# Patient Record
Sex: Male | Born: 1995 | Race: Black or African American | Hispanic: No | Marital: Single | Smoking: Never smoker
Health system: Southern US, Community
[De-identification: ages and names within clinical notes are randomized; demographics above are authoritative.]

## PROBLEM LIST (undated history)

## (undated) DIAGNOSIS — A549 Gonococcal infection, unspecified: Secondary | ICD-10-CM

---

## 2014-08-11 ENCOUNTER — Emergency Department (HOSPITAL_BASED_OUTPATIENT_CLINIC_OR_DEPARTMENT_OTHER)
Admission: EM | Admit: 2014-08-11 | Discharge: 2014-08-12 | Disposition: A | Discharge status: Home / Self Care | Payer: Self-pay | Attending: Emergency Medicine | Admitting: Emergency Medicine

## 2014-08-11 ENCOUNTER — Encounter (HOSPITAL_BASED_OUTPATIENT_CLINIC_OR_DEPARTMENT_OTHER): Payer: Self-pay

## 2014-08-11 DIAGNOSIS — R369 Urethral discharge, unspecified: Secondary | ICD-10-CM | POA: Insufficient documentation

## 2014-08-11 DIAGNOSIS — R300 Dysuria: Secondary | ICD-10-CM | POA: Insufficient documentation

## 2014-08-11 LAB — URINALYSIS, ROUTINE W REFLEX MICROSCOPIC
Glucose, UA: NEGATIVE mg/dL
HGB URINE DIPSTICK: NEGATIVE
Ketones, ur: 40 mg/dL — AB
Nitrite: NEGATIVE
PROTEIN: 30 mg/dL — AB
Specific Gravity, Urine: 1.035 — ABNORMAL HIGH (ref 1.005–1.030)
Urobilinogen, UA: 1 mg/dL (ref 0.0–1.0)
pH: 6.5 (ref 5.0–8.0)

## 2014-08-11 LAB — URINE MICROSCOPIC-ADD ON

## 2014-08-11 MED ORDER — AZITHROMYCIN 250 MG PO TABS
1000.0000 mg | ORAL_TABLET | Freq: Once | ORAL | Status: AC
  Administered 2014-08-11: 1000 mg via ORAL
  Filled 2014-08-11: qty 4

## 2014-08-11 MED ORDER — CEFTRIAXONE SODIUM 250 MG IJ SOLR
250.0000 mg | Freq: Once | INTRAMUSCULAR | Status: AC
  Administered 2014-08-11: 250 mg via INTRAMUSCULAR
  Filled 2014-08-11: qty 250

## 2014-08-11 MED ORDER — LIDOCAINE HCL (PF) 1 % IJ SOLN
INTRAMUSCULAR | Status: AC
  Administered 2014-08-11: 1 mL
  Filled 2014-08-11: qty 5

## 2014-08-11 NOTE — ED Notes (Signed)
Penile d/c-started yesterday

## 2014-08-11 NOTE — ED Notes (Signed)
C/o penile dc x 2 days  Burning w urination

## 2014-08-11 NOTE — ED Provider Notes (Signed)
CSN: [id number]     Arrival date & time 08/11/14  2202 History  This chart was scribed for Mirian MoMatthew Gentry, MD by Richarda Overlieichard Holland, ED Scribe. This patient was seen in room MH08/MH08 and the patient's care was started 11:01 PM.   Chief Complaint  Patient presents with  . Penile Discharge   Patient is a 19 y.o. male presenting with penile discharge. The history is provided by the patient.  Penile Discharge This is a new problem. The current episode started 12 to 24 hours ago. The problem occurs rarely. The problem has not changed since onset.Nothing aggravates the symptoms. Nothing relieves the symptoms. He has tried nothing for the symptoms.   HPI Comments: Kenneth [name] is a 19 y.o. male who presents to the Emergency Department complaining of penile discharge that started yesterday. He describes the discharge as greenish/yellow. Pt states he experiences some mild burning with urination. He denies any pain, nausea, vomiting or fevers.   History reviewed. No pertinent past medical history.   History reviewed. No pertinent past surgical history.   No family history on file.   History  Substance Use Topics  . Smoking status: Never Smoker   . Smokeless tobacco: Not on file  . Alcohol Use: No    Review of Systems  Constitutional: Negative for fever.  Gastrointestinal: Negative for nausea and vomiting.  Genitourinary: Positive for dysuria and discharge.  All other systems reviewed and are negative.  Allergies  Keflex  Home Medications   Prior to Admission medications   Not on File   BP 146/87 mmHg  Pulse 107  Temp(Src) 98.4 F (36.9 C)  Resp 16  Ht 5\' 9"  (1.753 m)  Wt 186 lb (84.369 kg)  BMI 27.45 kg/m2  SpO2 100% Physical Exam  Constitutional: He is oriented to person, place, and time. He appears well-developed and well-nourished.  HENT:  Head: Normocephalic and atraumatic.  Eyes: Conjunctivae and EOM are normal.  Neck: Normal range of motion. Neck supple.   Cardiovascular: Normal rate, regular rhythm and normal heart sounds.   Pulmonary/Chest: Effort normal and breath sounds normal. No respiratory distress.  Abdominal: He exhibits no distension. There is no tenderness. There is no rebound and no guarding. Hernia confirmed negative in the right inguinal area and confirmed negative in the left inguinal area.  Genitourinary: Testes normal. Cremasteric reflex is present. Right testis shows no swelling and no tenderness. Left testis shows no swelling and no tenderness. No penile erythema or penile tenderness. Discharge (scant) found.  Musculoskeletal: Normal range of motion.  Lymphadenopathy:       Right: No inguinal adenopathy present.       Left: No inguinal adenopathy present.  Neurological: He is alert and oriented to person, place, and time.  Skin: Skin is warm and dry.  Vitals reviewed.   ED Course  Procedures   DIAGNOSTIC STUDIES: Oxygen Saturation is 100% on RA, normal by my interpretation.    COORDINATION OF CARE: 11:04 PM Discussed treatment plan with pt at bedside and pt agreed to plan.   Labs Review Labs Reviewed  URINALYSIS, ROUTINE W REFLEX MICROSCOPIC (NOT AT [facility]) - Abnormal; Notable for the following:    Color, Urine AMBER (*)    APPearance CLOUDY (*)    Specific Gravity, Urine 1.035 (*)    Bilirubin Urine SMALL (*)    Ketones, ur 40 (*)    Protein, ur 30 (*)    Leukocytes, UA MODERATE (*)    All other components within normal  limits  URINE CULTURE  URINE MICROSCOPIC-ADD ON  GC/CHLAMYDIA PROBE AMP ([place]) NOT AT [facility]    Imaging Review No results found.   EKG Interpretation None      MDM   Final diagnoses:  Penile discharge    19 y.o. male without pertinent PMH presents with penile dc after sexual contact.  History and exam concerning for STI.  Swabbed dc.  Treated empirically and counseled testing/treatment of partner(s).  DC home in stable condition after rocephin/azithro.    I have reviewed all  laboratory and imaging studies if ordered as above  1. Penile discharge           Mirian Mo, MD 08/12/14 [phone]

## 2014-08-11 NOTE — Discharge Instructions (Signed)

## 2014-08-13 LAB — URINE CULTURE
Colony Count: NO GROWTH
Culture: NO GROWTH

## 2014-08-13 LAB — GC/CHLAMYDIA PROBE AMP (~~LOC~~) NOT AT ARMC
Chlamydia: NEGATIVE
Neisseria Gonorrhea: POSITIVE — AB

## 2014-08-14 ENCOUNTER — Telehealth (HOSPITAL_BASED_OUTPATIENT_CLINIC_OR_DEPARTMENT_OTHER): Payer: Self-pay | Admitting: Emergency Medicine

## 2014-08-15 ENCOUNTER — Telehealth (HOSPITAL_COMMUNITY): Payer: Self-pay

## 2014-08-17 ENCOUNTER — Telehealth (HOSPITAL_BASED_OUTPATIENT_CLINIC_OR_DEPARTMENT_OTHER): Payer: Self-pay | Admitting: Emergency Medicine

## 2014-09-01 ENCOUNTER — Telehealth (HOSPITAL_BASED_OUTPATIENT_CLINIC_OR_DEPARTMENT_OTHER): Payer: Self-pay | Admitting: Emergency Medicine

## 2014-09-01 NOTE — Telephone Encounter (Signed)
Lost to followup, letter returned, no known forwarding address

## 2014-09-20 ENCOUNTER — Telehealth (HOSPITAL_COMMUNITY): Payer: Self-pay

## 2014-09-20 NOTE — ED Notes (Signed)
Unable to contact pt by mail or telephone. Unable to communicate lab results or treatment changes. 

## 2015-06-01 ENCOUNTER — Encounter (HOSPITAL_BASED_OUTPATIENT_CLINIC_OR_DEPARTMENT_OTHER): Payer: Self-pay

## 2015-06-01 ENCOUNTER — Emergency Department (HOSPITAL_BASED_OUTPATIENT_CLINIC_OR_DEPARTMENT_OTHER)
Admission: EM | Admit: 2015-06-01 | Discharge: 2015-06-01 | Disposition: A | Discharge status: Home / Self Care | Payer: Managed Care, Other (non HMO) | Attending: Emergency Medicine | Admitting: Emergency Medicine

## 2015-06-01 DIAGNOSIS — Z8619 Personal history of other infectious and parasitic diseases: Secondary | ICD-10-CM | POA: Diagnosis not present

## 2015-06-01 DIAGNOSIS — N342 Other urethritis: Secondary | ICD-10-CM | POA: Insufficient documentation

## 2015-06-01 DIAGNOSIS — J029 Acute pharyngitis, unspecified: Secondary | ICD-10-CM | POA: Diagnosis not present

## 2015-06-01 HISTORY — DX: Gonococcal infection, unspecified: A54.9

## 2015-06-01 LAB — URINALYSIS, ROUTINE W REFLEX MICROSCOPIC
Bilirubin Urine: NEGATIVE
Glucose, UA: NEGATIVE mg/dL
Hgb urine dipstick: NEGATIVE
Ketones, ur: NEGATIVE mg/dL
NITRITE: NEGATIVE
PROTEIN: NEGATIVE mg/dL
Specific Gravity, Urine: 1.017 (ref 1.005–1.030)
pH: 7.5 (ref 5.0–8.0)

## 2015-06-01 LAB — URINE MICROSCOPIC-ADD ON

## 2015-06-01 MED ORDER — CEFTRIAXONE SODIUM 250 MG IJ SOLR
250.0000 mg | Freq: Once | INTRAMUSCULAR | Status: AC
  Administered 2015-06-01: 250 mg via INTRAMUSCULAR
  Filled 2015-06-01: qty 250

## 2015-06-01 MED ORDER — METRONIDAZOLE 500 MG PO TABS
2000.0000 mg | ORAL_TABLET | Freq: Once | ORAL | Status: AC
  Administered 2015-06-01: 2000 mg via ORAL
  Filled 2015-06-01: qty 4

## 2015-06-01 MED ORDER — LIDOCAINE HCL (PF) 1 % IJ SOLN
INTRAMUSCULAR | Status: AC
  Administered 2015-06-01: 1.2 mL
  Filled 2015-06-01: qty 5

## 2015-06-01 MED ORDER — AZITHROMYCIN 250 MG PO TABS
1000.0000 mg | ORAL_TABLET | Freq: Once | ORAL | Status: AC
  Administered 2015-06-01: 1000 mg via ORAL
  Filled 2015-06-01: qty 4

## 2015-06-01 NOTE — ED Provider Notes (Signed)
CSN: [id number]     Arrival date & time 06/01/15  1506 History   First MD Initiated Contact with Patient 06/01/15 1514     Chief Complaint  Patient presents with  . Sore Throat  . Penile Discharge      HPI  Patient presents for evaluation of a sore throat for the last 2 days. Also had urethral discharge for the last 2 days presents with watery, now yellow-green. Denies any pain. No bloody ejaculate. No testicular pain no groin mass or sores.  Sore throat but no fever. Occasional cough. Watery eyes.  Past Medical History  Diagnosis Date  . Gonorrhea    History reviewed. No pertinent past surgical history. History reviewed. No pertinent family history. Social History  Substance Use Topics  . Smoking status: Never Smoker   . Smokeless tobacco: None  . Alcohol Use: No    Review of Systems  Constitutional: Negative for fever, chills, diaphoresis, appetite change and fatigue.  HENT: Positive for sore throat. Negative for mouth sores and trouble swallowing.   Eyes: Negative for visual disturbance.  Respiratory: Negative for cough, chest tightness, shortness of breath and wheezing.   Cardiovascular: Negative for chest pain.  Gastrointestinal: Negative for nausea, vomiting, abdominal pain, diarrhea and abdominal distention.  Endocrine: Negative for polydipsia, polyphagia and polyuria.  Genitourinary: Positive for dysuria and discharge. Negative for frequency and hematuria.  Musculoskeletal: Negative for gait problem.  Skin: Negative for color change, pallor and rash.  Neurological: Negative for dizziness, syncope, light-headedness and headaches.  Hematological: Does not bruise/bleed easily.  Psychiatric/Behavioral: Negative for behavioral problems and confusion.      Allergies  Keflex  Home Medications   Prior to Admission medications   Not on File   BP 133/102 mmHg  Pulse 72  Temp(Src) 98 F (36.7 C) (Oral)  Resp 16  Ht  (1.803 m)  Wt 185 lb (83.915 kg)   BMI 25.81 kg/m2  SpO2 100% Physical Exam  Constitutional: He is oriented to person, place, and time. He appears well-developed and well-nourished. No distress.  HENT:  Head: Normocephalic.  Normal pharynx  Eyes: Conjunctivae are normal. Pupils are equal, round, and reactive to light. No scleral icterus.  Neck: Normal range of motion. Neck supple. No thyromegaly present.  Cardiovascular: Normal rate and regular rhythm.  Exam reveals no gallop and no friction rub.   No murmur heard. Pulmonary/Chest: Effort normal and breath sounds normal. No respiratory distress. He has no wheezes. He has no rales.  Abdominal: Soft. Bowel sounds are normal. He exhibits no distension. There is no tenderness. There is no rebound.  Genitourinary:     Musculoskeletal: Normal range of motion.  Neurological: He is alert and oriented to person, place, and time.  Skin: Skin is warm and dry. No rash noted.  Psychiatric: He has a normal mood and affect. His behavior is normal.    ED Course  Procedures (including critical care time) Labs Review Labs Reviewed  URINALYSIS, ROUTINE W REFLEX MICROSCOPIC (NOT AT [facility])  GC/CHLAMYDIA PROBE AMP ([place]) NOT AT [facility]    Imaging Review No results found. I have personally reviewed and evaluated these images and lab results as part of my medical decision-making.   EKG Interpretation None      MDM   Final diagnoses:  Urethritis  Viral pharyngitis    Swab obtained for GC, chlamydia. He had just urinated prior to sampling. Unable to express any discharge. So swab was obtained. Given I am Rocephin. In  review his chart he was treated with IM Rocephin last unit had no ill effects, despite his possible allergy to Keflex. Also given by mouth Zithromax, and Flagyl. . Use condoms. D/W partners.    Rolland PorterMark Keyontae Huckeby, MD 06/01/15 1537

## 2015-06-01 NOTE — Discharge Instructions (Signed)
Use condoms. Some STDs such as Herpes, Hepatitis, and AIDS are permanent Symptoms today are consistent with a sexual transmitted infection. Notify partners of possible infection.   Pharyngitis Pharyngitis is redness, pain, and swelling (inflammation) of your pharynx.  CAUSES  Pharyngitis is usually caused by infection. Most of the time, these infections are from viruses (viral) and are part of a cold. However, sometimes pharyngitis is caused by bacteria (bacterial). Pharyngitis can also be caused by allergies. Viral pharyngitis may be spread from person to person by coughing, sneezing, and personal items or utensils (cups, forks, spoons, toothbrushes). Bacterial pharyngitis may be spread from person to person by more intimate contact, such as kissing.  SIGNS AND SYMPTOMS  Symptoms of pharyngitis include:   Sore throat.   Tiredness (fatigue).   Low-grade fever.   Headache.  Joint pain and muscle aches.  Skin rashes.  Swollen lymph nodes.  Plaque-like film on throat or tonsils (often seen with bacterial pharyngitis). DIAGNOSIS  Your health care provider will ask you questions about your illness and your symptoms. Your medical history, along with a physical exam, is often all that is needed to diagnose pharyngitis. Sometimes, a rapid strep test is done. Other lab tests may also be done, depending on the suspected cause.  TREATMENT  Viral pharyngitis will usually get better in 3-4 days without the use of medicine. Bacterial pharyngitis is treated with medicines that kill germs (antibiotics).  HOME CARE INSTRUCTIONS   Drink enough water and fluids to keep your urine clear or pale yellow.   Only take over-the-counter or prescription medicines as directed by your health care provider:   If you are prescribed antibiotics, make sure you finish them even if you start to feel better.   Do not take aspirin.   Get lots of rest.   Gargle with 8 oz of salt water ( tsp of salt  per 1 qt of water) as often as every 1-2 hours to soothe your throat.   Throat lozenges (if you are not at risk for choking) or sprays may be used to soothe your throat. SEEK MEDICAL CARE IF:   You have large, tender lumps in your neck.  You have a rash.  You cough up green, yellow-brown, or bloody spit. SEEK IMMEDIATE MEDICAL CARE IF:   Your neck becomes stiff.  You drool or are unable to swallow liquids.  You vomit or are unable to keep medicines or liquids down.  You have severe pain that does not go away with the use of recommended medicines.  You have trouble breathing (not caused by a stuffy nose). MAKE SURE YOU:   Understand these instructions.  Will watch your condition.  Will get help right away if you are not doing well or get worse.   This information is not intended to replace advice given to you by your health care provider. Make sure you discuss any questions you have with your health care provider.   Document Released: 03/05/2005 Document Revised: 12/24/2012 Document Reviewed: 11/10/2012 Elsevier Interactive Patient Education 2016 Elsevier Inc.  Urethritis, Adult Urethritis is an inflammation of the tube through which urine exits your bladder (urethra).  CAUSES Urethritis is often caused by an infection in your urethra. The infection can be viral, like herpes. The infection can also be bacterial, like gonorrhea. RISK FACTORS Risk factors of urethritis include:  Having sex without using a condom.  Having multiple sexual partners.  Having poor hygiene. SIGNS AND SYMPTOMS Symptoms of urethritis are less noticeable in  women than in men. These symptoms include:  Burning feeling when you urinate (dysuria).  Discharge from your urethra.  Blood in your urine (hematuria).  Urinating more than usual. DIAGNOSIS  To confirm a diagnosis of urethritis, your health care provider will do the following:  Ask about your sexual history.  Perform a physical  exam.  Have you provide a sample of your urine for lab testing.  Use a cotton swab to gently collect a sample from your urethra for lab testing. TREATMENT  It is important to treat urethritis. Depending on the cause, untreated urethritis may lead to serious genital infections and possibly infertility. Urethritis caused by a bacterial infection is treated with antibiotic medicine. All sexual partners must be treated.  HOME CARE INSTRUCTIONS  Do not have sex until the test results are known and treatment is completed, even if your symptoms go away before you finish treatment.  If you were prescribed an antibiotic, finish it all even if you start to feel better. SEEK MEDICAL CARE IF:   Your symptoms are not improved in 3 days.  Your symptoms are getting worse.  You develop abdominal pain or pelvic pain (in women).  You develop joint pain.  You have a fever. SEEK IMMEDIATE MEDICAL CARE IF:   You have severe pain in the belly, back, or side.  You have repeated vomiting. MAKE SURE YOU:  Understand these instructions.  Will watch your condition.  Will get help right away if you are not doing well or get worse.   This information is not intended to replace advice given to you by your health care provider. Make sure you discuss any questions you have with your health care provider.   Document Released: 08/29/2000 Document Revised: 07/20/2014 Document Reviewed: 11/03/2012 Elsevier Interactive Patient Education [employer]2016 Elsevier Inc.

## 2015-06-01 NOTE — ED Notes (Signed)
Pt reports sore throat and penile discharge since Monday. Sexually active with no protection against STI's.

## 2015-06-02 LAB — GC/CHLAMYDIA PROBE AMP (~~LOC~~) NOT AT ARMC
CHLAMYDIA, DNA PROBE: POSITIVE — AB
NEISSERIA GONORRHEA: POSITIVE — AB

## 2015-06-20 ENCOUNTER — Telehealth (HOSPITAL_BASED_OUTPATIENT_CLINIC_OR_DEPARTMENT_OTHER): Payer: Self-pay | Admitting: Emergency Medicine

## 2015-06-20 NOTE — Telephone Encounter (Signed)
Lost to followup letter returned, no known address

## 2015-10-06 ENCOUNTER — Emergency Department (HOSPITAL_BASED_OUTPATIENT_CLINIC_OR_DEPARTMENT_OTHER)
Admission: EM | Admit: 2015-10-06 | Discharge: 2015-10-07 | Disposition: A | Discharge status: Home / Self Care | Payer: Managed Care, Other (non HMO) | Attending: Emergency Medicine | Admitting: Emergency Medicine

## 2015-10-06 ENCOUNTER — Encounter (HOSPITAL_BASED_OUTPATIENT_CLINIC_OR_DEPARTMENT_OTHER): Payer: Self-pay | Admitting: *Deleted

## 2015-10-06 DIAGNOSIS — R369 Urethral discharge, unspecified: Secondary | ICD-10-CM | POA: Diagnosis present

## 2015-10-06 LAB — URINE MICROSCOPIC-ADD ON: Bacteria, UA: NONE SEEN

## 2015-10-06 LAB — URINALYSIS, ROUTINE W REFLEX MICROSCOPIC
Glucose, UA: NEGATIVE mg/dL
Hgb urine dipstick: NEGATIVE
Ketones, ur: 15 mg/dL — AB
Nitrite: NEGATIVE
PH: 6 (ref 5.0–8.0)
Protein, ur: NEGATIVE mg/dL
Specific Gravity, Urine: 1.024 (ref 1.005–1.030)

## 2015-10-06 MED ORDER — CEFTRIAXONE SODIUM 250 MG IJ SOLR
250.0000 mg | Freq: Once | INTRAMUSCULAR | Status: AC
  Administered 2015-10-07: 250 mg via INTRAMUSCULAR
  Filled 2015-10-06: qty 250

## 2015-10-06 MED ORDER — AZITHROMYCIN 250 MG PO TABS
1000.0000 mg | ORAL_TABLET | Freq: Once | ORAL | Status: AC
  Administered 2015-10-07: 1000 mg via ORAL
  Filled 2015-10-06: qty 4

## 2015-10-06 NOTE — ED Notes (Signed)
Pt c/o having penile d/c states "cloudy" in color. Denies fevers. Denies any urinary symptoms. States he had sex with someone and the "condom broke" so he is concerned about an STD.

## 2015-10-06 NOTE — ED Provider Notes (Signed)
CSN: [id number]     Arrival date & time 10/06/15  2328 History   By signing my name below, I, Suzan SlickAshley N. Elon SpannerLeger, attest that this documentation has been prepared under the direction and in the presence of Geoffery Lyonsouglas Demetries Coia, MD.  Electronically Signed: Suzan SlickAshley N. Elon SpannerLeger, ED Scribe. 10/06/2015. 11:58 PM.   Chief Complaint  Patient presents with  . Penile Discharge   The history is provided by the patient. No language interpreter was used.    HPI Comments: Renford DillsJarrett S Brave is a 20 y.o. male with a PMHx of gonorrhea who presents to the Emergency Department complaining of constant, unchanged penile discharge onset this morning. Pt described discharge as watery and cloudy. No aggravating or alleviating factors. Pt states his condom broke last week while having sex. Denies any new sexual partners. Denies any fever, chills, dysuria, rashes, penile pain, or visible lesions. Pt with known allergy to Keflex.  PCP: No PCP Per Patient    Past Medical History  Diagnosis Date  . Gonorrhea    History reviewed. No pertinent past surgical history. No family history on file. Social History  Substance Use Topics  . Smoking status: Never Smoker   . Smokeless tobacco: None  . Alcohol Use: No    Review of Systems  Constitutional: Negative for fever and chills.  Genitourinary: Positive for discharge. Negative for dysuria, genital sores and penile pain.  All other systems reviewed and are negative.     Allergies  Keflex  Home Medications   Prior to Admission medications   Not on File   Triage Vitals: BP 144/90 mmHg  Pulse 96  Temp(Src) 98.9 F (37.2 C)  Resp 18  Ht 5\' 9"  (1.753 m)  Wt 175 lb (79.379 kg)  BMI 25.83 kg/m2  SpO2 97%   Physical Exam  Constitutional: He is oriented to person, place, and time. He appears well-developed and well-nourished.  HENT:  Head: Normocephalic.  Eyes: EOM are normal.  Neck: Normal range of motion.  Pulmonary/Chest: Effort normal.  Abdominal: He exhibits  no distension.  Genitourinary: Penis normal.  Slight urethral discharge present.  Musculoskeletal: Normal range of motion.  Neurological: He is alert and oriented to person, place, and time.  Psychiatric: He has a normal mood and affect.  Nursing note and vitals reviewed.   ED Course  Procedures (including critical care time)  DIAGNOSTIC STUDIES: Oxygen Saturation is 97% on RA, adequate by my interpretation.    COORDINATION OF CARE: 11:58 PM- Will order urinalysis. Will give Rocephin and Zithromax. Discussed treatment plan with pt at bedside and pt agreed to plan.     Labs Review Labs Reviewed  URINALYSIS, ROUTINE W REFLEX MICROSCOPIC (NOT AT [facility])    Imaging Review No results found. I have personally reviewed and evaluated these images and lab results as part of my medical decision-making.   EKG Interpretation None      MDM   Final diagnoses:  None    Urethral swab obtained. Will treat presumptively for STD with Rocephin and Zithromax.  I personally performed the services described in this documentation, which was scribed in my presence. The recorded information has been reviewed and is accurate.       Geoffery Lyonsouglas Nillie Bartolotta, MD 10/07/15 [phone]

## 2015-10-07 LAB — GC/CHLAMYDIA PROBE AMP (~~LOC~~) NOT AT ARMC
Chlamydia: POSITIVE — AB
NEISSERIA GONORRHEA: POSITIVE — AB

## 2015-10-07 NOTE — ED Notes (Signed)
Pt verbalizes understanding of d/c instructions and denies any further needs at this time. 

## 2015-10-07 NOTE — Discharge Instructions (Signed)
We will call you if your cultures indicate you require further treatment or action. °

## 2015-10-10 ENCOUNTER — Telehealth (HOSPITAL_BASED_OUTPATIENT_CLINIC_OR_DEPARTMENT_OTHER): Payer: Self-pay | Admitting: Emergency Medicine

## 2015-10-18 ENCOUNTER — Telehealth (HOSPITAL_BASED_OUTPATIENT_CLINIC_OR_DEPARTMENT_OTHER): Payer: Self-pay | Admitting: Emergency Medicine

## 2015-10-18 NOTE — Telephone Encounter (Signed)
Letter returned, no forwarding address, LOST TO FOLLOWUP

## 2015-12-13 ENCOUNTER — Emergency Department (HOSPITAL_BASED_OUTPATIENT_CLINIC_OR_DEPARTMENT_OTHER)
Admission: EM | Admit: 2015-12-13 | Discharge: 2015-12-13 | Disposition: A | Discharge status: Home / Self Care | Payer: Managed Care, Other (non HMO) | Attending: Emergency Medicine | Admitting: Emergency Medicine

## 2015-12-13 ENCOUNTER — Encounter (HOSPITAL_BASED_OUTPATIENT_CLINIC_OR_DEPARTMENT_OTHER): Payer: Self-pay | Admitting: *Deleted

## 2015-12-13 DIAGNOSIS — R369 Urethral discharge, unspecified: Secondary | ICD-10-CM | POA: Diagnosis present

## 2015-12-13 DIAGNOSIS — Z202 Contact with and (suspected) exposure to infections with a predominantly sexual mode of transmission: Secondary | ICD-10-CM | POA: Insufficient documentation

## 2015-12-13 DIAGNOSIS — Z711 Person with feared health complaint in whom no diagnosis is made: Secondary | ICD-10-CM

## 2015-12-13 LAB — URINALYSIS, ROUTINE W REFLEX MICROSCOPIC
Bilirubin Urine: NEGATIVE
Glucose, UA: NEGATIVE mg/dL
Hgb urine dipstick: NEGATIVE
KETONES UR: NEGATIVE mg/dL
NITRITE: NEGATIVE
Protein, ur: NEGATIVE mg/dL
Specific Gravity, Urine: 1.027 (ref 1.005–1.030)
pH: 6.5 (ref 5.0–8.0)

## 2015-12-13 LAB — URINE MICROSCOPIC-ADD ON

## 2015-12-13 LAB — GC/CHLAMYDIA PROBE AMP (~~LOC~~) NOT AT ARMC
CHLAMYDIA, DNA PROBE: NEGATIVE
NEISSERIA GONORRHEA: NEGATIVE

## 2015-12-13 MED ORDER — CEFTRIAXONE SODIUM 250 MG IJ SOLR
250.0000 mg | Freq: Once | INTRAMUSCULAR | Status: AC
  Administered 2015-12-13: 250 mg via INTRAMUSCULAR
  Filled 2015-12-13: qty 250

## 2015-12-13 MED ORDER — AZITHROMYCIN 250 MG PO TABS
1000.0000 mg | ORAL_TABLET | Freq: Once | ORAL | Status: AC
  Administered 2015-12-13: 1000 mg via ORAL
  Filled 2015-12-13: qty 4

## 2015-12-13 MED ORDER — LIDOCAINE HCL (PF) 1 % IJ SOLN
INTRAMUSCULAR | Status: AC
  Administered 2015-12-13: 1.2 mL
  Filled 2015-12-13: qty 5

## 2015-12-13 NOTE — ED Notes (Signed)
Penile dc onset last pm  Denies ua sx no other complaint

## 2015-12-13 NOTE — ED Provider Notes (Signed)
[facility]   CSN: [id number] Arrival date & time: 12/13/15  0048     History   Chief Complaint Chief Complaint  Patient presents with  . Penile Discharge    HPI Kenneth [name] is a 20 y.o. male.  HPI  This is a 20 year old male with history of gonorrhea who percents were penile discharge. Patient reports one-day history of penile discharge. He reports one sexual partner but thinks "she may have cheated on me." Inconsistent condom use. Denies dysuria or fevers. Denies penile lesions.  Past Medical History:  Diagnosis Date  . Gonorrhea     There are no active problems to display for this patient.   History reviewed. No pertinent surgical history.     Home Medications    Prior to Admission medications   Not on File    Family History No family history on file.  Social History Social History  Substance Use Topics  . Smoking status: Never Smoker  . Smokeless tobacco: Never Used  . Alcohol use No     Allergies   Keflex [cephalexin]   Review of Systems Review of Systems  Constitutional: Negative for fever.  Genitourinary: Positive for discharge. Negative for dysuria and penile swelling.  All other systems reviewed and are negative.    Physical Exam Updated Vital Signs BP 122/81 (BP Location: Left Arm)   Pulse 72   Temp 98 F (36.7 C) (Oral)   Resp 17   Ht 5\' 9"  (1.753 m)   Wt 175 lb (79.4 kg)   SpO2 99%   BMI 25.84 kg/m   Physical Exam  Constitutional: He is oriented to person, place, and time. He appears well-developed and well-nourished.  HENT:  Head: Normocephalic and atraumatic.  Cardiovascular: Normal rate and regular rhythm.   Pulmonary/Chest: Effort normal. No respiratory distress.  Genitourinary: Penis normal.  Genitourinary Comments: Circumcised penis, no obvious discharge or lesions noted  Neurological: He is alert and oriented to person, place, and time.  Skin: Skin is warm and dry.  Psychiatric:  He has a normal mood and affect.  Nursing note and vitals reviewed.    ED Treatments / Results  Labs (all labs ordered are listed, but only abnormal results are displayed) Labs Reviewed  URINALYSIS, ROUTINE W REFLEX MICROSCOPIC (NOT AT [facility]) - Abnormal; Notable for the following:       Result Value   APPearance CLOUDY (*)    Leukocytes, UA SMALL (*)    All other components within normal limits  URINE MICROSCOPIC-ADD ON - Abnormal; Notable for the following:    Squamous Epithelial / LPF 0-5 (*)    Bacteria, UA FEW (*)    Crystals CA OXALATE CRYSTALS (*)    All other components within normal limits  GC/CHLAMYDIA PROBE AMP ([place]) NOT AT [facility]    EKG  EKG Interpretation None       Radiology No results found.  Procedures Procedures (including critical care time)  Medications Ordered in ED Medications  cefTRIAXone (ROCEPHIN) injection 250 mg (250 mg Intramuscular Given 12/13/15 0217)  azithromycin (ZITHROMAX) tablet 1,000 mg (1,000 mg Oral Given 12/13/15 0217)  lidocaine (PF) (XYLOCAINE) 1 % injection (1.2 mLs  Given 12/13/15 0221)     Initial Impression / Assessment and Plan / ED Course  I have reviewed the triage vital signs and the nursing notes.  Pertinent labs & imaging results that were available during my care of the patient were reviewed by me and considered in my medical  decision making (see chart for details).  Clinical Course    Patient presents with penile discharge. Concern for STDs. Patient was swabbed and given Rocephin and azithromycin. No sexual contact for 10 days and have partners tested and treated. He was educated about safe sex practices.  After history, exam, and medical workup I feel the patient has been appropriately medically screened and is safe for discharge home. Pertinent diagnoses were discussed with the patient. Patient was given return precautions.   Final Clinical Impressions(s) / ED Diagnoses   Final diagnoses:  Concern about  STD in male without diagnosis  Penile discharge    New Prescriptions New Prescriptions   No medications on file     Shon Batonourtney F Tirzah Fross, MD 12/13/15 [phone]

## 2015-12-13 NOTE — Discharge Instructions (Signed)
You should abstain from sexual activity for 10 days. Always use condoms.

## 2016-01-04 ENCOUNTER — Telehealth (HOSPITAL_BASED_OUTPATIENT_CLINIC_OR_DEPARTMENT_OTHER): Payer: Self-pay | Admitting: Emergency Medicine

## 2016-01-04 NOTE — Telephone Encounter (Signed)
Lost to followup 

## 2016-05-08 ENCOUNTER — Encounter (HOSPITAL_BASED_OUTPATIENT_CLINIC_OR_DEPARTMENT_OTHER): Payer: Self-pay

## 2016-05-08 ENCOUNTER — Emergency Department (HOSPITAL_BASED_OUTPATIENT_CLINIC_OR_DEPARTMENT_OTHER)
Admission: EM | Admit: 2016-05-08 | Discharge: 2016-05-08 | Disposition: A | Discharge status: Home / Self Care | Payer: Managed Care, Other (non HMO) | Attending: Emergency Medicine | Admitting: Emergency Medicine

## 2016-05-08 DIAGNOSIS — J111 Influenza due to unidentified influenza virus with other respiratory manifestations: Secondary | ICD-10-CM

## 2016-05-08 DIAGNOSIS — H6592 Unspecified nonsuppurative otitis media, left ear: Secondary | ICD-10-CM | POA: Insufficient documentation

## 2016-05-08 DIAGNOSIS — R69 Illness, unspecified: Secondary | ICD-10-CM

## 2016-05-08 DIAGNOSIS — R05 Cough: Secondary | ICD-10-CM | POA: Diagnosis present

## 2016-05-08 MED ORDER — ACETAMINOPHEN 325 MG PO TABS
650.0000 mg | ORAL_TABLET | Freq: Once | ORAL | Status: AC
  Administered 2016-05-08: 650 mg via ORAL
  Filled 2016-05-08: qty 2

## 2016-05-08 MED ORDER — OSELTAMIVIR PHOSPHATE 75 MG PO CAPS: 75.0000 mg | ORAL_CAPSULE | Freq: Two times a day (BID) | ORAL | 0 refills | Status: DC

## 2016-05-08 NOTE — ED Triage Notes (Signed)
C/o flu like sx x 3 days-NAD-steady gait 

## 2016-05-08 NOTE — ED Provider Notes (Signed)
[facility] Provider Note   CSN: [id number] Arrival date & time: 05/08/16  1554   By signing my name below, I, Freida Busman, attest that this documentation has been prepared under the direction and in the presence of Arthor Captain, PA-C . Electronically Signed: Freida Busman, Scribe. 05/08/2016. 7:01 PM.  History   Chief Complaint Chief Complaint  Patient presents with  . Cough     The history is provided by the patient. No language interpreter was used.    HPI Comments:  Kenneth [name] is a 21 y.o. male who presents to the Emergency Department complaining of productive cough x 3 days. He reports associated chills, fever, and body aches. No flu shot received  this season. He has taken dayquil with moderate temporary relief. Pt notes recent sick contacts.  No sore throat.    Past Medical History:  Diagnosis Date  . Gonorrhea     There are no active problems to display for this patient.   History reviewed. No pertinent surgical history.     Home Medications    Prior to Admission medications   Not on File    Family History No family history on file.  Social History Social History  Substance Use Topics  . Smoking status: Never Smoker  . Smokeless tobacco: Never Used  . Alcohol use No     Allergies   Keflex [cephalexin]   Review of Systems Review of Systems  Constitutional: Positive for chills and fever.  HENT: Negative for sore throat.   Respiratory: Positive for cough.   Musculoskeletal: Positive for myalgias.     Physical Exam Updated Vital Signs BP 134/93 (BP Location: Left Arm)   Pulse (!) 145   Temp 100.4 F (38 C) (Oral)   Resp 22   Ht 5\' 8"  (1.727 m)   Wt 175 lb (79.4 kg)   SpO2 100%   BMI 26.61 kg/m   Physical Exam  Constitutional: He is oriented to person, place, and time. He appears well-developed and well-nourished. No distress.  HENT:  Head: Normocephalic and atraumatic.  Right Ear: Tympanic membrane normal.   Left Ear: A middle ear effusion is present.  Mouth/Throat: Posterior oropharyngeal erythema present. No oropharyngeal exudate. No tonsillar exudate.  Eyes: Conjunctivae are normal.  Cardiovascular: Normal rate.   Pulmonary/Chest: Effort normal.  Abdominal: He exhibits no distension.  Lymphadenopathy:       Head (right side): Tonsillar adenopathy present.       Head (left side): Tonsillar adenopathy present.  Mild tonsillar adenopathy  Neurological: He is alert and oriented to person, place, and time.  Skin: Skin is warm and dry.  Psychiatric: He has a normal mood and affect.  Nursing note and vitals reviewed.    ED Treatments / Results  DIAGNOSTIC STUDIES:  Oxygen Saturation is 100% on RA, normal by my interpretation.    COORDINATION OF CARE:  7:01 PM Discussed treatment plan with pt at bedside and pt agreed to plan.  Labs (all labs ordered are listed, but only abnormal results are displayed) Labs Reviewed - No data to display  EKG  EKG Interpretation None       Radiology No results found.  Procedures Procedures (including critical care time)  Medications Ordered in ED Medications  acetaminophen (TYLENOL) tablet 650 mg (650 mg Oral Given 05/08/16 1603)     Initial Impression / Assessment and Plan / ED Course  I have reviewed the triage vital signs and the nursing notes.  Pertinent labs &  imaging results that were available during my care of the patient were reviewed by me and considered in my medical decision making (see chart for details).     Patient with influenza-like illness. He'll be discharged with Tamiflu as recommended by CDC at this current flu season. Patient may continue treating with DayQuil. NyQuil. He is well appearing otherwise. Discussed return precautions.  Final Clinical Impressions(s) / ED Diagnoses   Final diagnoses:  Influenza-like illness    New Prescriptions New Prescriptions   No medications on file   I personally performed  the services described in this documentation, which was scribed in my presence. The recorded information has been reviewed and is accurate.       Arthor Captainbigail Lakayla Barrington, PA-C 05/08/16 1950    Linwood DibblesJon Knapp, MD 05/10/16 2055

## 2016-05-08 NOTE — Discharge Instructions (Signed)

## 2016-05-08 NOTE — ED Notes (Signed)
ED Provider at bedside. 

## 2016-05-29 ENCOUNTER — Encounter (HOSPITAL_BASED_OUTPATIENT_CLINIC_OR_DEPARTMENT_OTHER): Payer: Self-pay | Admitting: *Deleted

## 2016-05-29 ENCOUNTER — Emergency Department (HOSPITAL_BASED_OUTPATIENT_CLINIC_OR_DEPARTMENT_OTHER)
Admission: EM | Admit: 2016-05-29 | Discharge: 2016-05-29 | Disposition: A | Discharge status: Home / Self Care | Payer: Managed Care, Other (non HMO) | Attending: Emergency Medicine | Admitting: Emergency Medicine

## 2016-05-29 DIAGNOSIS — H579 Unspecified disorder of eye and adnexa: Secondary | ICD-10-CM | POA: Diagnosis present

## 2016-05-29 DIAGNOSIS — H1033 Unspecified acute conjunctivitis, bilateral: Secondary | ICD-10-CM

## 2016-05-29 MED ORDER — ERYTHROMYCIN 5 MG/GM OP OINT: TOPICAL_OINTMENT | OPHTHALMIC | 0 refills | Status: DC

## 2016-05-29 MED ORDER — FLUORESCEIN SODIUM 0.6 MG OP STRP
1.0000 | ORAL_STRIP | Freq: Once | OPHTHALMIC | Status: AC
  Administered 2016-05-29: 1 via OPHTHALMIC
  Filled 2016-05-29: qty 1

## 2016-05-29 MED ORDER — TETRACAINE HCL 0.5 % OP SOLN
2.0000 [drp] | Freq: Once | OPHTHALMIC | Status: AC
  Administered 2016-05-29: 2 [drp] via OPHTHALMIC
  Filled 2016-05-29: qty 4

## 2016-05-29 MED FILL — ERYTHROMYCIN EYE OINTMENT: 5 | 10 days supply | Qty: 4 | Fill #0

## 2016-05-29 NOTE — Discharge Instructions (Signed)
Medications: Erythromycin ointment  Treatment: Apply a 1/2 inch ribbon to his lower eyelid 4 times daily for 7 days, or until told otherwise by the ophthalmologist. Do not rub your eyes. Use warm compresses on your eyes one to 2 times daily. Wash your hands regularly.  Follow-up: Please follow-up with Dr. Genia DelMincey, an ophthalmologist, within the next 48 hours for further evaluation and treatment of your symptoms. Please return to emergency department or see Dr. Genia DelMincey immediately if you develop any new or worsening symptoms including vision changes, pain with moving your eye, increasing swelling, or any other concerning symptoms.

## 2016-05-29 NOTE — ED Provider Notes (Signed)
[facility]   CSN: [id number] Arrival date & time: 05/29/16  1200     History   Chief Complaint Chief Complaint  Patient presents with  . Eye Problem    HPI Kenneth [name] is a 21 y.o. male who presents with a six-day history of eye irritation. Patient states he began on Thursday with right eye irritation with yellow drainage. Patient has been waking up with matted eyes. Over the past 6 days he has also had symptoms in his left eye. He denies any vision changes or pain, but does have a foreign body sensation. Patient has had exposure to pinkeye. Patient also works in an area of the [employer] and is wondering if he may have gotten something in his eye. He does not remember a specific instance. Patient has been using his sister's antibiotic eyedrops with some relief, however it is not completely resolving. Patient denies any fevers, nasal congestion, cough, or any other symptoms.  HPI  Past Medical History:  Diagnosis Date  . Gonorrhea     There are no active problems to display for this patient.   History reviewed. No pertinent surgical history.     Home Medications    Prior to Admission medications   Medication Sig Start Date End Date Taking? Authorizing Provider  erythromycin ophthalmic ointment Place a 1/2 inch ribbon of ointment into the lower eyelids four times daily. 05/29/16   Emi Holes, PA-C  oseltamivir (TAMIFLU) 75 MG capsule Take 1 capsule (75 mg total) by mouth every 12 (twelve) hours. 05/08/16   Arthor Captain, PA-C    Family History No family history on file.  Social History Social History  Substance Use Topics  . Smoking status: Never Smoker  . Smokeless tobacco: Never Used  . Alcohol use No     Allergies   Keflex [cephalexin]   Review of Systems Review of Systems  Constitutional: Negative for fever.  HENT: Negative for congestion.   Eyes: Positive for discharge and itching. Negative for photophobia and visual  disturbance.  Respiratory: Negative for cough.   Skin: Negative for rash and wound.     Physical Exam Updated Vital Signs BP 134/82   Pulse 73   Temp 98.5 F (36.9 C) (Oral)   Resp 16   Ht 5\' 7"  (1.702 m)   Wt 77.1 kg   SpO2 99%   BMI 26.63 kg/m   Physical Exam  Constitutional: He appears well-developed and well-nourished. No distress.  HENT:  Head: Normocephalic and atraumatic.  Mouth/Throat: Oropharynx is clear and moist. No oropharyngeal exudate.  Eyes: EOM are normal. Pupils are equal, round, and reactive to light. Lids are everted and swept, no foreign bodies found. Right eye exhibits discharge (yellow). Left eye exhibits discharge (yellow). Right conjunctiva is injected. Left conjunctiva is injected. No scleral icterus.  No uptake on fluorescein stain bilaterally; no pain with extraocular movement; no photophobia or consensual photophobia; no periorbital tenderness, but mild edema to right lower lid Visual acuity bilaterally 20/15, right 20/20, left 20/20  Neck: Normal range of motion. Neck supple. No thyromegaly present.  Cardiovascular: Normal rate, regular rhythm, normal heart sounds and intact distal pulses.  Exam reveals no gallop and no friction rub.   No murmur heard. Pulmonary/Chest: Effort normal and breath sounds normal. No stridor. No respiratory distress. He has no wheezes. He has no rales.  Abdominal: Soft. Bowel sounds are normal. He exhibits no distension. There is no tenderness. There is no rebound and no  guarding.  Musculoskeletal: He exhibits no edema.  Lymphadenopathy:    He has no cervical adenopathy.  Neurological: He is alert. Coordination normal.  Skin: Skin is warm and dry. No rash noted. He is not diaphoretic. No pallor.  Psychiatric: He has a normal mood and affect.  Nursing note and vitals reviewed.    ED Treatments / Results  Labs (all labs ordered are listed, but only abnormal results are displayed) Labs Reviewed - No data to  display  EKG  EKG Interpretation None       Radiology No results found.  Procedures Procedures (including critical care time)  Medications Ordered in ED Medications  tetracaine (PONTOCAINE) 0.5 % ophthalmic solution 2 drop (2 drops Both Eyes Given 05/29/16 1225)  fluorescein ophthalmic strip 1 strip (1 strip Both Eyes Given 05/29/16 1249)     Initial Impression / Assessment and Plan / ED Course  I have reviewed the triage vital signs and the nursing notes.  Pertinent labs & imaging results that were available during my care of the patient were reviewed by me and considered in my medical decision making (see chart for details).     Patient with bilateral conjunctivitis. Considering treatment with antibiotic drops or any, will have patient follow-up with ophthalmology for further evaluation and treatment. Will discharge home with erythromycin ointment. Patient advised to follow-up with ophthalmology within 48 hours. Good visual acuity in the ED. Supportive treatment discussed including warm compresses and frequent hand washing. Strict return precautions given. Patient understands and agrees with plan. Patient vitals stable throughout ED course discharged in satisfactory condition.  Final Clinical Impressions(s) / ED Diagnoses   Final diagnoses:  Acute conjunctivitis of both eyes, unspecified acute conjunctivitis type    New Prescriptions Discharge Medication List as of 05/29/2016  1:20 PM    START taking these medications   Details  erythromycin ophthalmic ointment Place a 1/2 inch ribbon of ointment into the lower eyelids four times daily., Print         Emi Holeslexandra M Levonne Carreras, PA-C 05/29/16 1659    Doug SouSam Jacubowitz, MD 05/30/16 [phone]

## 2016-05-29 NOTE — ED Triage Notes (Signed)
States his eyes are irritated. Started a week ago. He was exposed to pink eye.

## 2016-11-22 ENCOUNTER — Encounter (HOSPITAL_BASED_OUTPATIENT_CLINIC_OR_DEPARTMENT_OTHER): Payer: Self-pay | Admitting: *Deleted

## 2016-11-22 ENCOUNTER — Emergency Department (HOSPITAL_BASED_OUTPATIENT_CLINIC_OR_DEPARTMENT_OTHER)
Admission: EM | Admit: 2016-11-22 | Discharge: 2016-11-22 | Disposition: A | Discharge status: Home / Self Care | Payer: Managed Care, Other (non HMO) | Attending: Emergency Medicine | Admitting: Emergency Medicine

## 2016-11-22 DIAGNOSIS — Z79899 Other long term (current) drug therapy: Secondary | ICD-10-CM | POA: Insufficient documentation

## 2016-11-22 DIAGNOSIS — R040 Epistaxis: Secondary | ICD-10-CM | POA: Diagnosis not present

## 2016-11-22 NOTE — ED Provider Notes (Signed)
[facility]   CSN: [id number] Arrival date & time: 11/22/16  1727     History   Chief Complaint Chief Complaint  Patient presents with  . Epistaxis    HPI Kenneth [name] is a 21 y.o. male.  HPI Patient presents to ED for evaluation of nosebleed that occurred at work earlier today. He does have a history of nosebleeds when he was younger. States that the bleeding stopped after a few minutes with direct pressure and ice application. He denies any other symptoms at this time including lightheadedness, headache injury, trauma to area, nausea, vomiting, other URI symptoms.  Past Medical History:  Diagnosis Date  . Gonorrhea     There are no active problems to display for this patient.   History reviewed. No pertinent surgical history.     Home Medications    Prior to Admission medications   Medication Sig Start Date End Date Taking? Authorizing Provider  erythromycin ophthalmic ointment Place a 1/2 inch ribbon of ointment into the lower eyelids four times daily. 05/29/16   Law, Waylan Boga, PA-C  oseltamivir (TAMIFLU) 75 MG capsule Take 1 capsule (75 mg total) by mouth every 12 (twelve) hours. 05/08/16   Arthor Captain, PA-C    Family History No family history on file.  Social History Social History  Substance Use Topics  . Smoking status: Never Smoker  . Smokeless tobacco: Never Used  . Alcohol use No     Allergies   Keflex [cephalexin]   Review of Systems Review of Systems  HENT: Positive for nosebleeds. Negative for congestion, dental problem, postnasal drip, rhinorrhea, sinus pain, sinus pressure and sore throat.   Respiratory: Negative for cough and shortness of breath.   Gastrointestinal: Negative for nausea and vomiting.     Physical Exam Updated Vital Signs BP 132/75   Pulse 69   Temp 98.2 F (36.8 C) (Oral)   Resp 16   Ht  (1.753 m)   Wt 74.8 kg (165 lb)   SpO2 99%   BMI 24.37 kg/m   Physical Exam    Constitutional: He appears well-developed and well-nourished. No distress.  Nontoxic-appearing and in no acute distress.  HENT:  Head: Normocephalic and atraumatic.  No active epistaxis or dried blood noted.  Eyes: Conjunctivae and EOM are normal. No scleral icterus.  Neck: Normal range of motion.  Pulmonary/Chest: Effort normal. No respiratory distress.  Neurological: He is alert.  Skin: No rash noted. He is not diaphoretic.  Psychiatric: He has a normal mood and affect.  Nursing note and vitals reviewed.    ED Treatments / Results  Labs (all labs ordered are listed, but only abnormal results are displayed) Labs Reviewed - No data to display  EKG  EKG Interpretation None       Radiology No results found.  Procedures Procedures (including critical care time)  Medications Ordered in ED Medications - No data to display   Initial Impression / Assessment and Plan / ED Course  I have reviewed the triage vital signs and the nursing notes.  Pertinent labs & imaging results that were available during my care of the patient were reviewed by me and considered in my medical decision making (see chart for details).     Patient presents to ED for evaluation of epistaxis that lasted for a few minutes earlier today while at work. Does have a history of epistaxis when he was younger. States that bleeding stopped with direct pressure and ice application.  He is not on any blood thinners. He has no active bleeding at this time. He is afebrile with a history of fever. He denies any head injury or trauma to the area. He is nontoxic-appearing and in no acute distress. Patient given instructions for nosebleeds in the future. Strict return precautions given.  Final Clinical Impressions(s) / ED Diagnoses   Final diagnoses:  Epistaxis    New Prescriptions New Prescriptions   No medications on file     Dietrich PatesKhatri, Terik Haughey, Cordelia Poche-C 11/22/16 1912    Arby BarrettePfeiffer, Marcy, MD 11/29/16 [phone]

## 2016-11-22 NOTE — Discharge Instructions (Signed)
Please read attached information regarding your condition. Use Afrin as needed for nosebleeds if no improvement with direct pressure. Return to ED for worsening bleeding, injury, lightheadedness, vomiting up blood.

## 2016-11-22 NOTE — ED Triage Notes (Signed)
Nosebleed today. No active bleeding at this time.

## 2017-05-06 ENCOUNTER — Other Ambulatory Visit: Payer: Self-pay

## 2017-05-06 ENCOUNTER — Emergency Department (HOSPITAL_BASED_OUTPATIENT_CLINIC_OR_DEPARTMENT_OTHER)
Admission: EM | Admit: 2017-05-06 | Discharge: 2017-05-06 | Disposition: A | Discharge status: Home / Self Care | Payer: Managed Care, Other (non HMO) | Attending: Emergency Medicine | Admitting: Emergency Medicine

## 2017-05-06 ENCOUNTER — Encounter (HOSPITAL_BASED_OUTPATIENT_CLINIC_OR_DEPARTMENT_OTHER): Payer: Self-pay

## 2017-05-06 DIAGNOSIS — Z5321 Procedure and treatment not carried out due to patient leaving prior to being seen by health care provider: Secondary | ICD-10-CM | POA: Insufficient documentation

## 2017-05-06 DIAGNOSIS — R350 Frequency of micturition: Secondary | ICD-10-CM | POA: Insufficient documentation

## 2017-05-06 NOTE — ED Triage Notes (Signed)
C/o "dark urine" mild urinary frq x 3 days-denies penile-d/c-NAD-steady gait

## 2018-04-09 ENCOUNTER — Emergency Department (HOSPITAL_BASED_OUTPATIENT_CLINIC_OR_DEPARTMENT_OTHER): Payer: Managed Care, Other (non HMO)

## 2018-04-09 ENCOUNTER — Encounter (HOSPITAL_BASED_OUTPATIENT_CLINIC_OR_DEPARTMENT_OTHER): Payer: Self-pay

## 2018-04-09 ENCOUNTER — Other Ambulatory Visit: Payer: Self-pay

## 2018-04-09 ENCOUNTER — Emergency Department (HOSPITAL_BASED_OUTPATIENT_CLINIC_OR_DEPARTMENT_OTHER)
Admission: EM | Admit: 2018-04-09 | Discharge: 2018-04-09 | Disposition: A | Discharge status: Home / Self Care | Payer: Managed Care, Other (non HMO) | Attending: Emergency Medicine | Admitting: Emergency Medicine

## 2018-04-09 DIAGNOSIS — S93402A Sprain of unspecified ligament of left ankle, initial encounter: Secondary | ICD-10-CM

## 2018-04-09 DIAGNOSIS — Y9302 Activity, running: Secondary | ICD-10-CM | POA: Insufficient documentation

## 2018-04-09 DIAGNOSIS — Y999 Unspecified external cause status: Secondary | ICD-10-CM | POA: Insufficient documentation

## 2018-04-09 DIAGNOSIS — Y929 Unspecified place or not applicable: Secondary | ICD-10-CM | POA: Insufficient documentation

## 2018-04-09 DIAGNOSIS — X501XXA Overexertion from prolonged static or awkward postures, initial encounter: Secondary | ICD-10-CM | POA: Insufficient documentation

## 2018-04-09 MED ORDER — IBUPROFEN 400 MG PO TABS
600.0000 mg | ORAL_TABLET | Freq: Once | ORAL | Status: AC
  Administered 2018-04-09: 12:00:00 600 mg via ORAL
  Filled 2018-04-09: qty 1

## 2018-04-09 NOTE — ED Triage Notes (Signed)
Pt states he "rolled" left ankle last night-NAD-steady gait

## 2018-04-09 NOTE — ED Notes (Signed)
Patient transported to X-ray 

## 2018-04-09 NOTE — ED Notes (Signed)
Pt states rolled left ankle last pm while running  Pos pedal pulse,  Ice applied

## 2018-04-09 NOTE — Discharge Instructions (Signed)
You have an ankle sprain, your x-ray shows no evidence of fracture.  Please use ankle brace and crutches, try and stay off of the ankle as much as possible, ice and elevate.  Use ibuprofen and Tylenol every 6 hours as needed for pain.  If symptoms are not improving over the next week please follow-up with Dr. Pearletha Forge with sports medicine.

## 2018-04-09 NOTE — ED Notes (Signed)
NAD at this time. Pt is stable and going home.  

## 2018-04-09 NOTE — ED Provider Notes (Signed)
[facility] EMERGENCY DEPARTMENT Provider Note   CSN: [id number] Arrival date & time: 04/09/18  1108     History   Chief Complaint Chief Complaint  Patient presents with  . Ankle Injury    HPI Kenneth [name] is a 23 y.o. male.  Kenneth [name] is a 23 y.o. male who is otherwise healthy, presents for evaluation of left ankle injury.  He reports he was running last night when he stepped on a tree root and rolled his left ankle.  He reports he felt like there was a pop in the ankle but then it seemed to feel okay but when he woke up this morning the ankle was swollen over the lateral aspect and he had a lot of pain with weightbearing and movement.  He denies any numbness tingling or weakness in the ankle or foot.  No pain at the hip or knee.  He has not taken anything for pain prior to arrival.  No prior injury or surgery to the ankle.  No wounds or abrasions.  No other aggravating or alleviating factors.     Past Medical History:  Diagnosis Date  . Gonorrhea     There are no active problems to display for this patient.   History reviewed. No pertinent surgical history.      Home Medications    Prior to Admission medications   Not on File    Family History No family history on file.  Social History Social History   Tobacco Use  . Smoking status: Never Smoker  . Smokeless tobacco: Never Used  Substance Use Topics  . Alcohol use: No  . Drug use: No     Allergies   Keflex [cephalexin]   Review of Systems Review of Systems  Constitutional: Negative for chills and fever.  Musculoskeletal: Positive for arthralgias and joint swelling.  Skin: Negative for color change, rash and wound.  Neurological: Negative for weakness and numbness.     Physical Exam Updated Vital Signs BP (!) 132/94 (BP Location: Left Arm)   Pulse 79   Temp 98.6 F (37 C) (Oral)   Resp 16   Ht 5\' 9"  (1.753 m)   Wt 72.6 kg   SpO2 98%   BMI 23.63 kg/m   Physical  Exam Vitals signs and nursing note reviewed.  Constitutional:      General: He is not in acute distress.    Appearance: He is well-developed. He is not diaphoretic.  HENT:     Head: Normocephalic and atraumatic.  Eyes:     General:        Right eye: No discharge.        Left eye: No discharge.  Pulmonary:     Effort: Pulmonary effort is normal. No respiratory distress.  Musculoskeletal:     Comments: There is swelling and tenderness over the lateral malleolus.No overt deformity. No tenderness over the medial aspect of the ankle. The fifth metatarsal is not tender. The ankle joint is intact without excessive opening on stressing. 2+ DP and PT pulses, good capillary refill, normal sensation and strength.  Skin:    General: Skin is warm and dry.  Neurological:     Mental Status: He is alert.     Coordination: Coordination normal.  Psychiatric:        Mood and Affect: Mood normal.        Behavior: Behavior normal.      ED Treatments / Results  Labs (all labs ordered are  listed, but only abnormal results are displayed) Labs Reviewed - No data to display  EKG None  Radiology Dg Ankle Complete Left  Result Date: 04/09/2018 CLINICAL DATA:  Left ankle pain following twisting injury, initial encounter EXAM: LEFT ANKLE COMPLETE - 3+ VIEW COMPARISON:  None. FINDINGS: There is no evidence of fracture, dislocation, or joint effusion. There is no evidence of arthropathy or other focal bone abnormality. Soft tissues are unremarkable. IMPRESSION: No acute abnormality noted. Electronically Signed   By: Alcide Clever M.D.   On: 04/09/2018 11:36    Procedures Procedures (including critical care time)  Medications Ordered in ED Medications - No data to display   Initial Impression / Assessment and Plan / ED Course  I have reviewed the triage vital signs and the nursing notes.  Pertinent labs & imaging results that were available during my care of the patient were reviewed by me and  considered in my medical decision making (see chart for details).  Presentation consistent with ankle sprain. Tenderness and swelling over lateral malleolus, pt is neurovascularly intact, and x-ray negative for fracture, and shows ankle mortise is intact. Pain treated in the ED. Pt placed in ASO brace and provided crutches, ambulated without difficulty. Pt stable for discharge home with ibuprofen for pain. Pt to follow-up with ortho in one week if symptoms not improving. Return precautions discussed, Pt expresses understanding and agrees with plan.   Final Clinical Impressions(s) / ED Diagnoses   Final diagnoses:  Sprain of left ankle, unspecified ligament, initial encounter    ED Discharge Orders    None       Dartha Lodge, [place] 04/09/18 1159    [place], Ambrose Finland, MD 04/12/18 1556

## 2018-12-31 ENCOUNTER — Emergency Department (HOSPITAL_BASED_OUTPATIENT_CLINIC_OR_DEPARTMENT_OTHER)
Admission: EM | Admit: 2018-12-31 | Discharge: 2018-12-31 | Disposition: A | Discharge status: Home / Self Care | Payer: Self-pay | Attending: Emergency Medicine | Admitting: Emergency Medicine

## 2018-12-31 ENCOUNTER — Other Ambulatory Visit: Payer: Self-pay

## 2018-12-31 DIAGNOSIS — Y92410 Unspecified street and highway as the place of occurrence of the external cause: Secondary | ICD-10-CM | POA: Insufficient documentation

## 2018-12-31 DIAGNOSIS — Y999 Unspecified external cause status: Secondary | ICD-10-CM | POA: Insufficient documentation

## 2018-12-31 DIAGNOSIS — S39012A Strain of muscle, fascia and tendon of lower back, initial encounter: Secondary | ICD-10-CM | POA: Insufficient documentation

## 2018-12-31 DIAGNOSIS — S161XXA Strain of muscle, fascia and tendon at neck level, initial encounter: Secondary | ICD-10-CM | POA: Insufficient documentation

## 2018-12-31 DIAGNOSIS — Y9389 Activity, other specified: Secondary | ICD-10-CM | POA: Insufficient documentation

## 2018-12-31 MED ORDER — CYCLOBENZAPRINE HCL 10 MG PO TABS: 10.0000 mg | ORAL_TABLET | Freq: Two times a day (BID) | ORAL | 0 refills | Status: DC | PRN

## 2018-12-31 MED ORDER — NAPROXEN 500 MG PO TABS: 500.0000 mg | ORAL_TABLET | Freq: Two times a day (BID) | ORAL | 0 refills | Status: DC

## 2018-12-31 MED FILL — NAPROXEN 500 MG TABS: 500 | 10 days supply | Qty: 20 | Fill #0

## 2018-12-31 MED FILL — CYCLOBENZAPRINE HCL 10 MG T: 10 | 10 days supply | Qty: 20 | Fill #0

## 2018-12-31 NOTE — Discharge Instructions (Signed)
Over the next few days the pain may get worse before it starts getting better.  If the pain is no better after 1 week try seeing a therapeutic masseuse or chiropractor.  Take the anti-inflammatory medication and use the muscle relaxer at least before you go to bed but if you feel very tight you can also use it during the day it just may make you feel tired.
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
## 2019-02-13 ENCOUNTER — Encounter (HOSPITAL_BASED_OUTPATIENT_CLINIC_OR_DEPARTMENT_OTHER): Payer: Self-pay | Admitting: Emergency Medicine

## 2019-02-13 ENCOUNTER — Emergency Department (HOSPITAL_BASED_OUTPATIENT_CLINIC_OR_DEPARTMENT_OTHER): Payer: No Typology Code available for payment source

## 2019-02-13 ENCOUNTER — Emergency Department (HOSPITAL_BASED_OUTPATIENT_CLINIC_OR_DEPARTMENT_OTHER)
Admission: EM | Admit: 2019-02-13 | Discharge: 2019-02-13 | Disposition: A | Discharge status: Home / Self Care | Payer: No Typology Code available for payment source | Attending: Emergency Medicine | Admitting: Emergency Medicine

## 2019-02-13 ENCOUNTER — Other Ambulatory Visit: Payer: Self-pay

## 2019-02-13 DIAGNOSIS — R0789 Other chest pain: Secondary | ICD-10-CM | POA: Insufficient documentation

## 2019-02-13 DIAGNOSIS — Y999 Unspecified external cause status: Secondary | ICD-10-CM | POA: Diagnosis not present

## 2019-02-13 DIAGNOSIS — M546 Pain in thoracic spine: Secondary | ICD-10-CM | POA: Insufficient documentation

## 2019-02-13 DIAGNOSIS — Y92410 Unspecified street and highway as the place of occurrence of the external cause: Secondary | ICD-10-CM | POA: Insufficient documentation

## 2019-02-13 DIAGNOSIS — Y9389 Activity, other specified: Secondary | ICD-10-CM | POA: Diagnosis not present

## 2019-02-13 MED ORDER — NAPROXEN 500 MG PO TABS: 500.0000 mg | ORAL_TABLET | Freq: Two times a day (BID) | ORAL | 0 refills | Status: AC

## 2019-02-13 MED ORDER — CYCLOBENZAPRINE HCL 10 MG PO TABS: 10.0000 mg | ORAL_TABLET | Freq: Two times a day (BID) | ORAL | 0 refills | Status: AC | PRN
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
## 2019-02-13 NOTE — Discharge Instructions (Signed)
I have prescribed muscle relaxers for your pain, please do not drink or drive while taking this medications as they can make you drowsy.  I  Please follow-up with PCP in 1 week for reevaluation of your symptoms.  You experience any bowel or bladder incontinence, fever, worsening in your symptoms please return to the ED.

## 2019-02-16 MED FILL — CYCLOBENZAPRINE HCL 10 MG T: 10 | 7 days supply | Qty: 14 | Fill #0

## 2019-02-16 MED FILL — NAPROXEN 500 MG TABS: 500 | 7 days supply | Qty: 14 | Fill #0

## 2019-04-29 ENCOUNTER — Emergency Department (HOSPITAL_COMMUNITY)
Admission: EM | Admit: 2019-04-29 | Discharge: 2019-04-29 | Disposition: A | Discharge status: Home / Self Care | Payer: Self-pay | Attending: Emergency Medicine | Admitting: Emergency Medicine

## 2019-04-29 ENCOUNTER — Other Ambulatory Visit: Payer: Self-pay

## 2019-04-29 ENCOUNTER — Encounter (HOSPITAL_COMMUNITY): Payer: Self-pay | Admitting: Emergency Medicine

## 2019-04-29 DIAGNOSIS — Z711 Person with feared health complaint in whom no diagnosis is made: Secondary | ICD-10-CM

## 2019-04-29 DIAGNOSIS — Z202 Contact with and (suspected) exposure to infections with a predominantly sexual mode of transmission: Secondary | ICD-10-CM | POA: Insufficient documentation

## 2019-04-29 LAB — URINALYSIS, ROUTINE W REFLEX MICROSCOPIC
Bacteria, UA: NONE SEEN
Bilirubin Urine: NEGATIVE
Glucose, UA: NEGATIVE mg/dL
Hgb urine dipstick: NEGATIVE
Ketones, ur: NEGATIVE mg/dL
Nitrite: NEGATIVE
Protein, ur: 30 mg/dL — AB
Specific Gravity, Urine: 1.029 (ref 1.005–1.030)
WBC, UA: >50 WBC/hpf — ABNORMAL HIGH (ref 0–5)
pH: 6 (ref 5.0–8.0)

## 2019-04-29 MED ORDER — CEFTRIAXONE SODIUM 500 MG IJ SOLR
500.0000 mg | Freq: Once | INTRAMUSCULAR | Status: AC
  Administered 2019-04-29: 07:00:00 500 mg via INTRAMUSCULAR
  Filled 2019-04-29: qty 500

## 2019-04-29 MED ORDER — STERILE WATER FOR INJECTION IJ SOLN
INTRAMUSCULAR | Status: AC
  Filled 2019-04-29: qty 10

## 2019-04-29 MED ORDER — AZITHROMYCIN 1 G PO PACK
1.0000 g | PACK | Freq: Once | ORAL | Status: AC
  Administered 2019-04-29: 07:00:00 1 g via ORAL
  Filled 2019-04-29: qty 1

## 2019-04-29 NOTE — ED Provider Notes (Signed)
Kenneth [name]   CSN: [id number] Arrival date & time: 04/29/19  6213     History Chief Complaint  Patient presents with  . Penile Discharge    Kenneth [name] is a 24 y.o. male with no relevant PMH presents to the ED with a 4-day history of penile discharge.  Patient describes this penile discharge as white, milky.  He reports that he has been having unprotected sexual intercourse with one new male partner.  He is unsure whether not she is symptomatic.  He denies any fevers or chills, recent illness, abdominal pain, nausea or vomiting, testicular pain or swelling, pain with defecation, dysuria, hematuria, hematochezia, or other symptoms.  Offered patient HIV and syphilis testing to which he declined.  HPI     Past Medical History:  Diagnosis Date  . Gonorrhea     There are no problems to display for this patient.   History reviewed. No pertinent surgical history.     No family history on file.  Social History   Tobacco Use  . Smoking status: Never Smoker  . Smokeless tobacco: Never Used  Substance Use Topics  . Alcohol use: No  . Drug use: No    Home Medications Prior to Admission medications   Not on File    Allergies    Keflex [cephalexin]  Review of Systems   Review of Systems  Constitutional: Negative for chills and fever.  Gastrointestinal: Negative for abdominal pain and vomiting.  Genitourinary: Positive for discharge. Negative for dysuria, penile pain and testicular pain.    Physical Exam Updated Vital Signs BP (!) 140/94 (BP Location: Right Arm)   Pulse 70   Temp 97.9 F (36.6 C) (Oral)   Resp (!) 22   SpO2 100%   Physical Exam Vitals and nursing [name] reviewed. Exam conducted with a chaperone present.  Constitutional:      Appearance: Normal appearance.  HENT:     Head: Normocephalic and atraumatic.  Eyes:     General: No scleral icterus.    Conjunctiva/sclera: Conjunctivae  normal.  Cardiovascular:     Rate and Rhythm: Normal rate and regular rhythm.     Pulses: Normal pulses.     Heart sounds: Normal heart sounds.  Pulmonary:     Effort: Pulmonary effort is normal. No respiratory distress.     Breath sounds: Normal breath sounds.  Genitourinary:    Comments: Uncircumcised.  No significant penile discharge expressed on physical exam. No testicular swelling or TTP.  No rashes appreciated.   Musculoskeletal:     Cervical back: Normal range of motion and neck supple. No rigidity.  Skin:    General: Skin is dry.  Neurological:     Mental Status: He is alert.     GCS: GCS eye subscore is 4. GCS verbal subscore is 5. GCS motor subscore is 6.  Psychiatric:        Mood and Affect: Mood normal.        Behavior: Behavior normal.        Thought Content: Thought content normal.     ED Results / Procedures / Treatments   Labs (all labs ordered are listed, but only abnormal results are displayed) Labs Reviewed  URINALYSIS, ROUTINE W REFLEX MICROSCOPIC - Abnormal; Notable for the following components:      Result Value   APPearance HAZY (*)    Protein, ur 30 (*)    Leukocytes,Ua MODERATE (*)    WBC, UA >50 (*)  All other components within normal limits  GC/CHLAMYDIA PROBE AMP ([place]) NOT AT [facility]    EKG None  Radiology No results found.  Procedures Procedures (including critical care time)  Medications Ordered in ED Medications  sterile water (preservative free) injection (has no administration in time range)  cefTRIAXone (ROCEPHIN) injection 500 mg (500 mg Intramuscular Given 04/29/19 0700)  azithromycin (ZITHROMAX) powder 1 g (1 g Oral Given 04/29/19 0700)    ED Course  I have reviewed the triage vital signs and the nursing notes.  Pertinent labs & imaging results that were available during my care of the patient were reviewed by me and considered in my medical decision making (see chart for details).    MDM Rules/Calculators/A&P                       Patient is afebrile without abdominal tenderness, abdominal pain or painful bowel movements to indicate prostatitis.  No tenderness to palpation of the testes or epididymis to suggest orchitis or epididymitis.  STD cultures obtained including gonorrhea and chlamydia. Patient to be discharged with instructions to follow up with PCP. Discussed importance of using protection when sexually active. Pt understands that they have GC/Chlamydia cultures pending and that they will need to inform all sexual partners if results return positive. Patient has been treated prophylactically with azithromycin and Rocephin.     Final Clinical Impression(s) / ED Diagnoses Final diagnoses:  Concern about STD in male without diagnosis    Rx / DC Orders ED Discharge Orders    None       Corena Herter, PA-C 04/29/19 0700    Hayden Rasmussen, MD 04/29/19 1700

## 2019-04-29 NOTE — ED Notes (Signed)
Patient Alert and oriented to baseline. Stable and ambulatory to baseline. Patient verbalized understanding of the discharge instructions.  Patient belongings were taken by the patient.   

## 2019-04-29 NOTE — ED Triage Notes (Signed)
Patient reports " milky " penile discharge onset Sunday , denies dysuria or fever .

## 2019-04-29 NOTE — Discharge Instructions (Addendum)
You have been treated presumptively today for gonorrhea and chlamydia.   You have been tested today for gonorrhea and chlamydia. These results will be available in approximately 3 days. You may check your MyChart account for results. Please inform all sexual partners of positive results and that they should be tested and treated as well.  Please wait 2 weeks and be sure that you and your partners are symptom free before returning to sexual activity. Please use protection with every sexual encounter.  Follow Up: Please followup with your primary doctor in 3 days for discussion of your diagnoses and further evaluation after today's visit; if you do not have a primary care doctor use the resource guide provided to find one; Please return to the ER for worsening symptoms, high fevers or persistent vomiting.

## 2019-04-30 LAB — GC/CHLAMYDIA PROBE AMP (~~LOC~~) NOT AT ARMC
Chlamydia: POSITIVE — AB
Neisseria Gonorrhea: NEGATIVE

## 2019-09-04 ENCOUNTER — Encounter (HOSPITAL_COMMUNITY): Payer: Self-pay

## 2019-09-04 ENCOUNTER — Emergency Department (HOSPITAL_COMMUNITY)
Admission: EM | Admit: 2019-09-04 | Discharge: 2019-09-04 | Disposition: A | Discharge status: Home / Self Care | Payer: Self-pay | Attending: Emergency Medicine | Admitting: Emergency Medicine

## 2019-09-04 ENCOUNTER — Other Ambulatory Visit: Payer: Self-pay

## 2019-09-04 DIAGNOSIS — Z5321 Procedure and treatment not carried out due to patient leaving prior to being seen by health care provider: Secondary | ICD-10-CM | POA: Insufficient documentation

## 2019-09-04 DIAGNOSIS — R040 Epistaxis: Secondary | ICD-10-CM | POA: Insufficient documentation

## 2019-09-04 NOTE — ED Triage Notes (Signed)
Pt arrives POV for eval of epistaxis twice this week. Pt reports he "messes with his nose" and then it starts bleeding. Had one lasting 10 minutes earlier this week, and another lasting 3 minutes today. No bleeding in triage, watching youtube videos in no distress. No thinners, no cocaine use.

## 2019-09-04 NOTE — ED Notes (Signed)
No answer in lobby.

## 2019-09-04 NOTE — ED Notes (Signed)
Have called for patient x 2 in lobby with no answer.

## 2019-10-27 ENCOUNTER — Inpatient Hospital Stay (HOSPITAL_COMMUNITY): Payer: Self-pay | Admitting: Certified Registered Nurse Anesthetist

## 2019-10-27 ENCOUNTER — Inpatient Hospital Stay (HOSPITAL_COMMUNITY): Payer: Self-pay

## 2019-10-27 ENCOUNTER — Other Ambulatory Visit: Payer: Self-pay

## 2019-10-27 ENCOUNTER — Emergency Department (HOSPITAL_COMMUNITY): Payer: Self-pay

## 2019-10-27 ENCOUNTER — Encounter (HOSPITAL_COMMUNITY): Payer: Self-pay | Admitting: *Deleted

## 2019-10-27 ENCOUNTER — Inpatient Hospital Stay (HOSPITAL_COMMUNITY)
Admission: EM | Admit: 2019-10-27 | Discharge: 2019-10-29 | DRG: 493 | Disposition: A | Discharge status: Home / Self Care | Payer: Self-pay | Attending: Student | Admitting: Student

## 2019-10-27 ENCOUNTER — Encounter (HOSPITAL_COMMUNITY)
Admission: EM | Disposition: A | Discharge status: Home / Self Care | Payer: Self-pay | Source: Home / Self Care | Attending: Orthopedic Surgery

## 2019-10-27 DIAGNOSIS — S82891B Other fracture of right lower leg, initial encounter for open fracture type I or II: Secondary | ICD-10-CM

## 2019-10-27 DIAGNOSIS — S82831B Other fracture of upper and lower end of right fibula, initial encounter for open fracture type I or II: Principal | ICD-10-CM | POA: Diagnosis present

## 2019-10-27 DIAGNOSIS — S91031A Puncture wound without foreign body, right ankle, initial encounter: Secondary | ICD-10-CM | POA: Diagnosis present

## 2019-10-27 DIAGNOSIS — Z20822 Contact with and (suspected) exposure to covid-19: Secondary | ICD-10-CM | POA: Diagnosis present

## 2019-10-27 DIAGNOSIS — T148XXA Other injury of unspecified body region, initial encounter: Secondary | ICD-10-CM

## 2019-10-27 DIAGNOSIS — S82871B Displaced pilon fracture of right tibia, initial encounter for open fracture type I or II: Secondary | ICD-10-CM | POA: Diagnosis present

## 2019-10-27 DIAGNOSIS — Z881 Allergy status to other antibiotic agents status: Secondary | ICD-10-CM

## 2019-10-27 DIAGNOSIS — Z419 Encounter for procedure for purposes other than remedying health state, unspecified: Secondary | ICD-10-CM

## 2019-10-27 DIAGNOSIS — W3400XA Accidental discharge from unspecified firearms or gun, initial encounter: Secondary | ICD-10-CM

## 2019-10-27 HISTORY — PX: EXTERNAL FIXATION LEG: SHX1549

## 2019-10-27 LAB — BASIC METABOLIC PANEL
Anion gap: 11 (ref 5–15)
BUN: 8 mg/dL (ref 6–20)
CO2: 25 mmol/L (ref 22–32)
Calcium: 9.3 mg/dL (ref 8.9–10.3)
Chloride: 106 mmol/L (ref 98–111)
Creatinine, Ser: 1.43 mg/dL — ABNORMAL HIGH (ref 0.61–1.24)
GFR calc Af Amer: >60 mL/min (ref >60)
GFR calc non Af Amer: >60 mL/min (ref >60)
Glucose, Bld: 116 mg/dL — ABNORMAL HIGH (ref 70–99)
Potassium: 4 mmol/L (ref 3.5–5.1)
Sodium: 142 mmol/L (ref 135–145)

## 2019-10-27 LAB — CBC WITH DIFFERENTIAL/PLATELET
Abs Immature Granulocytes: 0.1 10*3/uL — ABNORMAL HIGH (ref 0.00–0.07)
Basophils Absolute: 0.1 10*3/uL (ref 0.0–0.1)
Basophils Relative: 1 %
Eosinophils Absolute: 0.1 10*3/uL (ref 0.0–0.5)
Eosinophils Relative: 1 %
HCT: 46.5 % (ref 39.0–52.0)
Hemoglobin: 15.2 g/dL (ref 13.0–17.0)
Immature Granulocytes: 1 %
Lymphocytes Relative: 17 %
Lymphs Abs: 2.1 10*3/uL (ref 0.7–4.0)
MCH: 31.2 pg (ref 26.0–34.0)
MCHC: 32.7 g/dL (ref 30.0–36.0)
MCV: 95.5 fL (ref 80.0–100.0)
Monocytes Absolute: 0.8 10*3/uL (ref 0.1–1.0)
Monocytes Relative: 6 %
Neutro Abs: 9.1 10*3/uL — ABNORMAL HIGH (ref 1.7–7.7)
Neutrophils Relative %: 74 %
Platelets: 206 10*3/uL (ref 150–400)
RBC: 4.87 MIL/uL (ref 4.22–5.81)
RDW: 12.7 % (ref 11.5–15.5)
WBC: 12.2 10*3/uL — ABNORMAL HIGH (ref 4.0–10.5)
nRBC: 0 /100 WBC

## 2019-10-27 LAB — TYPE AND SCREEN
ABO/RH(D): O POS
Antibody Screen: NEGATIVE

## 2019-10-27 LAB — HIV ANTIBODY (ROUTINE TESTING W REFLEX): HIV Screen 4th Generation wRfx: NONREACTIVE

## 2019-10-27 LAB — SARS CORONAVIRUS 2 BY RT PCR (HOSPITAL ORDER, PERFORMED IN ~~LOC~~ HOSPITAL LAB): SARS Coronavirus 2: NEGATIVE

## 2019-10-27 LAB — ABO/RH: ABO/RH(D): O POS

## 2019-10-27 SURGERY — EXTERNAL FIXATION, LOWER EXTREMITY
Anesthesia: General | Site: Ankle | Laterality: Right

## 2019-10-27 MED ORDER — HYDROMORPHONE HCL 1 MG/ML IJ SOLN
0.5000 mg | INTRAMUSCULAR | Status: DC | PRN
  Administered 2019-10-27 – 2019-10-29 (×4): 1 mg via INTRAVENOUS
  Filled 2019-10-27 (×4): qty 1

## 2019-10-27 MED ORDER — STERILE WATER FOR IRRIGATION IR SOLN
Status: DC | PRN
  Administered 2019-10-27: 1000 mL

## 2019-10-27 MED ORDER — POTASSIUM CHLORIDE IN NACL 20-0.45 MEQ/L-% IV SOLN
INTRAVENOUS | Status: DC
  Filled 2019-10-27 (×3): qty 1000

## 2019-10-27 MED ORDER — METOCLOPRAMIDE HCL 5 MG/ML IJ SOLN: 5.0000 mg | Freq: Three times a day (TID) | INTRAMUSCULAR | Status: DC | PRN

## 2019-10-27 MED ORDER — OXYCODONE HCL 5 MG PO TABS: 5.0000 mg | ORAL_TABLET | ORAL | Status: DC | PRN

## 2019-10-27 MED ORDER — BISACODYL 10 MG RE SUPP: 10.0000 mg | Freq: Every day | RECTAL | Status: DC | PRN

## 2019-10-27 MED ORDER — PROPOFOL 10 MG/ML IV BOLUS
INTRAVENOUS | Status: AC
  Filled 2019-10-27: qty 20

## 2019-10-27 MED ORDER — DEXMEDETOMIDINE HCL 200 MCG/2ML IV SOLN
INTRAVENOUS | Status: DC | PRN
  Administered 2019-10-27 (×3): 4 ug via INTRAVENOUS

## 2019-10-27 MED ORDER — DOCUSATE SODIUM 100 MG PO CAPS
100.0000 mg | ORAL_CAPSULE | Freq: Two times a day (BID) | ORAL | Status: DC
  Administered 2019-10-27 – 2019-10-28 (×3): 100 mg via ORAL
  Filled 2019-10-27 (×3): qty 1

## 2019-10-27 MED ORDER — ENOXAPARIN SODIUM 40 MG/0.4ML ~~LOC~~ SOLN
40.0000 mg | SUBCUTANEOUS | Status: DC
  Administered 2019-10-28: 40 mg via SUBCUTANEOUS
  Filled 2019-10-27: qty 0.4

## 2019-10-27 MED ORDER — ONDANSETRON HCL 4 MG PO TABS: 4.0000 mg | ORAL_TABLET | Freq: Four times a day (QID) | ORAL | Status: DC | PRN

## 2019-10-27 MED ORDER — DIPHENHYDRAMINE HCL 12.5 MG/5ML PO ELIX: 12.5000 mg | ORAL_SOLUTION | ORAL | Status: DC | PRN

## 2019-10-27 MED ORDER — CHLORHEXIDINE GLUCONATE 0.12 % MT SOLN: 15.0000 mL | Freq: Once | OROMUCOSAL | Status: AC

## 2019-10-27 MED ORDER — LACTATED RINGERS IV SOLN: INTRAVENOUS | Status: DC

## 2019-10-27 MED ORDER — METHOCARBAMOL 500 MG PO TABS
500.0000 mg | ORAL_TABLET | Freq: Four times a day (QID) | ORAL | Status: DC | PRN
  Administered 2019-10-27 – 2019-10-29 (×4): 500 mg via ORAL
  Filled 2019-10-27 (×4): qty 1

## 2019-10-27 MED ORDER — MORPHINE SULFATE (PF) 2 MG/ML IV SOLN: 2.0000 mg | INTRAVENOUS | Status: DC | PRN

## 2019-10-27 MED ORDER — FENTANYL CITRATE (PF) 250 MCG/5ML IJ SOLN
INTRAMUSCULAR | Status: DC | PRN
  Administered 2019-10-27: 25 ug via INTRAVENOUS
  Administered 2019-10-27: 50 ug via INTRAVENOUS
  Administered 2019-10-27 (×3): 25 ug via INTRAVENOUS

## 2019-10-27 MED ORDER — POVIDONE-IODINE 10 % EX SWAB: 2.0000 "application " | Freq: Once | CUTANEOUS | Status: DC

## 2019-10-27 MED ORDER — SODIUM CHLORIDE 0.9 % IR SOLN
Status: DC | PRN
  Administered 2019-10-27: 3000 mL

## 2019-10-27 MED ORDER — CEFAZOLIN SODIUM-DEXTROSE 2-4 GM/100ML-% IV SOLN
2.0000 g | Freq: Four times a day (QID) | INTRAVENOUS | Status: AC
  Administered 2019-10-27 – 2019-10-28 (×3): 2 g via INTRAVENOUS
  Filled 2019-10-27 (×3): qty 100

## 2019-10-27 MED ORDER — ACETAMINOPHEN 325 MG PO TABS: 325.0000 mg | ORAL_TABLET | Freq: Four times a day (QID) | ORAL | Status: DC | PRN

## 2019-10-27 MED ORDER — DEXAMETHASONE SODIUM PHOSPHATE 10 MG/ML IJ SOLN
INTRAMUSCULAR | Status: DC | PRN
  Administered 2019-10-27: 10 mg via INTRAVENOUS

## 2019-10-27 MED ORDER — VANCOMYCIN HCL IN DEXTROSE 1-5 GM/200ML-% IV SOLN
1000.0000 mg | INTRAVENOUS | Status: AC
  Administered 2019-10-27: 1000 mg via INTRAVENOUS

## 2019-10-27 MED ORDER — VANCOMYCIN HCL IN DEXTROSE 1-5 GM/200ML-% IV SOLN
1000.0000 mg | Freq: Once | INTRAVENOUS | Status: DC
  Filled 2019-10-27 (×2): qty 200

## 2019-10-27 MED ORDER — SENNA 8.6 MG PO TABS
1.0000 | ORAL_TABLET | Freq: Two times a day (BID) | ORAL | Status: DC
  Administered 2019-10-27 – 2019-10-28 (×3): 8.6 mg via ORAL
  Filled 2019-10-27 (×3): qty 1

## 2019-10-27 MED ORDER — ACETAMINOPHEN 500 MG PO TABS
1000.0000 mg | ORAL_TABLET | Freq: Four times a day (QID) | ORAL | Status: AC
  Administered 2019-10-27 – 2019-10-28 (×4): 1000 mg via ORAL
  Filled 2019-10-27 (×4): qty 2

## 2019-10-27 MED ORDER — ONDANSETRON HCL 4 MG/2ML IJ SOLN: 4.0000 mg | Freq: Four times a day (QID) | INTRAMUSCULAR | Status: DC | PRN

## 2019-10-27 MED ORDER — METHOCARBAMOL 1000 MG/10ML IJ SOLN
500.0000 mg | Freq: Four times a day (QID) | INTRAVENOUS | Status: DC | PRN
  Filled 2019-10-27: qty 5

## 2019-10-27 MED ORDER — PROPOFOL 10 MG/ML IV BOLUS
INTRAVENOUS | Status: DC | PRN
  Administered 2019-10-27: 180 mg via INTRAVENOUS

## 2019-10-27 MED ORDER — HYDROMORPHONE HCL 1 MG/ML IJ SOLN
1.0000 mg | Freq: Once | INTRAMUSCULAR | Status: AC
  Administered 2019-10-27: 1 mg via INTRAMUSCULAR
  Filled 2019-10-27: qty 1

## 2019-10-27 MED ORDER — 0.9 % SODIUM CHLORIDE (POUR BTL) OPTIME
TOPICAL | Status: DC | PRN
  Administered 2019-10-27: 1000 mL

## 2019-10-27 MED ORDER — MIDAZOLAM HCL 2 MG/2ML IJ SOLN
INTRAMUSCULAR | Status: AC
  Filled 2019-10-27: qty 2

## 2019-10-27 MED ORDER — HYDROMORPHONE HCL 1 MG/ML IJ SOLN
INTRAMUSCULAR | Status: AC
  Filled 2019-10-27: qty 1

## 2019-10-27 MED ORDER — OXYCODONE HCL 5 MG PO TABS
10.0000 mg | ORAL_TABLET | ORAL | Status: DC | PRN
  Administered 2019-10-27: 15 mg via ORAL
  Administered 2019-10-28 – 2019-10-29 (×2): 10 mg via ORAL
  Filled 2019-10-27: qty 3

## 2019-10-27 MED ORDER — ZOLPIDEM TARTRATE 5 MG PO TABS: 5.0000 mg | ORAL_TABLET | Freq: Every evening | ORAL | Status: DC | PRN

## 2019-10-27 MED ORDER — METOCLOPRAMIDE HCL 5 MG PO TABS: 5.0000 mg | ORAL_TABLET | Freq: Three times a day (TID) | ORAL | Status: DC | PRN

## 2019-10-27 MED ORDER — ESMOLOL HCL 100 MG/10ML IV SOLN
INTRAVENOUS | Status: DC | PRN
  Administered 2019-10-27: 20 mg via INTRAVENOUS

## 2019-10-27 MED ORDER — FENTANYL CITRATE (PF) 250 MCG/5ML IJ SOLN
INTRAMUSCULAR | Status: AC
  Filled 2019-10-27: qty 5

## 2019-10-27 MED ORDER — ONDANSETRON HCL 4 MG/2ML IJ SOLN
INTRAMUSCULAR | Status: DC | PRN
  Administered 2019-10-27: 4 mg via INTRAVENOUS

## 2019-10-27 MED ORDER — HYDROMORPHONE HCL 1 MG/ML IJ SOLN: 1.0000 mg | INTRAMUSCULAR | Status: DC | PRN

## 2019-10-27 MED ORDER — CHLORHEXIDINE GLUCONATE 4 % EX LIQD: 60.0000 mL | Freq: Once | CUTANEOUS | Status: DC

## 2019-10-27 MED ORDER — HYDROMORPHONE HCL 1 MG/ML IJ SOLN
1.0000 mg | Freq: Once | INTRAMUSCULAR | Status: AC
  Administered 2019-10-27: 1 mg via INTRAVENOUS
  Filled 2019-10-27: qty 1

## 2019-10-27 MED ORDER — METHOCARBAMOL 500 MG PO TABS: 500.0000 mg | ORAL_TABLET | Freq: Four times a day (QID) | ORAL | Status: DC | PRN

## 2019-10-27 MED ORDER — HYDROMORPHONE HCL 1 MG/ML IJ SOLN
0.5000 mg | Freq: Once | INTRAMUSCULAR | Status: AC
  Administered 2019-10-27: 0.5 mg via INTRAVENOUS

## 2019-10-27 MED ORDER — LIDOCAINE 2% (20 MG/ML) 5 ML SYRINGE
INTRAMUSCULAR | Status: DC | PRN
  Administered 2019-10-27: 60 mg via INTRAVENOUS

## 2019-10-27 MED ORDER — OXYCODONE HCL 5 MG PO TABS
5.0000 mg | ORAL_TABLET | ORAL | Status: DC | PRN
  Administered 2019-10-28 (×2): 10 mg via ORAL
  Filled 2019-10-27 (×4): qty 2

## 2019-10-27 MED ORDER — CHLORHEXIDINE GLUCONATE 0.12 % MT SOLN
OROMUCOSAL | Status: AC
  Administered 2019-10-27: 15 mL via OROMUCOSAL
  Filled 2019-10-27: qty 15

## 2019-10-27 MED ORDER — POLYETHYLENE GLYCOL 3350 17 G PO PACK: 17.0000 g | PACK | Freq: Every day | ORAL | Status: DC | PRN

## 2019-10-27 MED ORDER — KETOROLAC TROMETHAMINE 15 MG/ML IJ SOLN
7.5000 mg | Freq: Four times a day (QID) | INTRAMUSCULAR | Status: AC
  Administered 2019-10-27 – 2019-10-28 (×4): 7.5 mg via INTRAVENOUS
  Filled 2019-10-27 (×4): qty 1

## 2019-10-27 MED ORDER — MIDAZOLAM HCL 2 MG/2ML IJ SOLN
INTRAMUSCULAR | Status: DC | PRN
  Administered 2019-10-27: 2 mg via INTRAVENOUS

## 2019-10-27 MED ORDER — MAGNESIUM CITRATE PO SOLN: 1.0000 | Freq: Once | ORAL | Status: DC | PRN

## 2019-10-27 SURGICAL SUPPLY — 69 items
BANDAGE ESMARK 6X9 LF (GAUZE/BANDAGES/DRESSINGS) IMPLANT
BAR GLASS FIBER EXFX 11X350 (EXFIX) ×3 IMPLANT
BAR GLASS FIBER EXFX 11X400 (EXFIX) ×3 IMPLANT
BNDG COHESIVE 6X5 TAN STRL LF (GAUZE/BANDAGES/DRESSINGS) ×3 IMPLANT
BNDG ELASTIC 4X5.8 VLCR STR LF (GAUZE/BANDAGES/DRESSINGS) ×3 IMPLANT
BNDG ELASTIC 6X5.8 VLCR STR LF (GAUZE/BANDAGES/DRESSINGS) IMPLANT
BNDG ESMARK 6X9 LF (GAUZE/BANDAGES/DRESSINGS)
BNDG GAUZE ELAST 4 BULKY (GAUZE/BANDAGES/DRESSINGS) ×3 IMPLANT
CLAMP BLUE BAR TO PIN (EXFIX) ×6 IMPLANT
CLEANER TIP ELECTROSURG 2X2 (MISCELLANEOUS) ×3 IMPLANT
COVER SURGICAL LIGHT HANDLE (MISCELLANEOUS) ×3 IMPLANT
COVER WAND RF STERILE (DRAPES) IMPLANT
CUFF TOURN SGL QUICK 18X4 (TOURNIQUET CUFF) IMPLANT
CUFF TOURN SGL QUICK 24 (TOURNIQUET CUFF) ×2
CUFF TOURN SGL QUICK 34 (TOURNIQUET CUFF)
CUFF TRNQT CYL 24X4X16.5-23 (TOURNIQUET CUFF) ×1 IMPLANT
CUFF TRNQT CYL 34X4.125X (TOURNIQUET CUFF) IMPLANT
DRAPE C-ARM 42X72 X-RAY (DRAPES) ×3 IMPLANT
DRAPE U-SHAPE 47X51 STRL (DRAPES) ×3 IMPLANT
DRSG ADAPTIC 3X8 NADH LF (GAUZE/BANDAGES/DRESSINGS) IMPLANT
ELECT REM PT RETURN 9FT ADLT (ELECTROSURGICAL) ×3
ELECTRODE REM PT RTRN 9FT ADLT (ELECTROSURGICAL) ×1 IMPLANT
EVACUATOR 1/8 PVC DRAIN (DRAIN) IMPLANT
GAUZE SPONGE 4X4 12PLY STRL (GAUZE/BANDAGES/DRESSINGS) IMPLANT
GAUZE SPONGE 4X4 12PLY STRL LF (GAUZE/BANDAGES/DRESSINGS) ×3 IMPLANT
GAUZE XEROFORM 5X9 LF (GAUZE/BANDAGES/DRESSINGS) ×3 IMPLANT
GLOVE BIO SURGEON STRL SZ 6.5 (GLOVE) ×2 IMPLANT
GLOVE BIO SURGEON STRL SZ7.5 (GLOVE) ×3 IMPLANT
GLOVE BIO SURGEONS STRL SZ 6.5 (GLOVE) ×1
GLOVE BIOGEL PI IND STRL 7.0 (GLOVE) ×1 IMPLANT
GLOVE BIOGEL PI INDICATOR 7.0 (GLOVE) ×2
GLOVE BIOGEL PI ORTHO PRO SZ8 (GLOVE) ×4
GLOVE ORTHO TXT STRL SZ7.5 (GLOVE) ×3 IMPLANT
GLOVE PI ORTHO PRO STRL SZ8 (GLOVE) ×2 IMPLANT
GLOVE SURG ORTHO 8.0 STRL STRW (GLOVE) IMPLANT
GOWN STRL REUS W/ TWL XL LVL3 (GOWN DISPOSABLE) ×4 IMPLANT
GOWN STRL REUS W/TWL 2XL LVL3 (GOWN DISPOSABLE) IMPLANT
GOWN STRL REUS W/TWL XL LVL3 (GOWN DISPOSABLE) ×8
HANDPIECE INTERPULSE COAX TIP (DISPOSABLE) ×2
KIT BASIN OR (CUSTOM PROCEDURE TRAY) ×3 IMPLANT
KIT TURNOVER KIT B (KITS) ×3 IMPLANT
MANIFOLD NEPTUNE II (INSTRUMENTS) ×3 IMPLANT
NEEDLE 22X1 1/2 (OR ONLY) (NEEDLE) IMPLANT
NS IRRIG 1000ML POUR BTL (IV SOLUTION) ×3 IMPLANT
PACK ORTHO EXTREMITY (CUSTOM PROCEDURE TRAY) ×3 IMPLANT
PAD ARMBOARD 7.5X6 YLW CONV (MISCELLANEOUS) ×6 IMPLANT
PADDING CAST COTTON 6X4 STRL (CAST SUPPLIES) IMPLANT
PIN CLAMP 2BAR 75MM BLUE (EXFIX) ×3 IMPLANT
PIN HALF YELLOW 5X160X35 (EXFIX) ×6 IMPLANT
PIN TRANSFIXING 5.0 (EXFIX) ×3 IMPLANT
SET HNDPC FAN SPRY TIP SCT (DISPOSABLE) ×1 IMPLANT
SPONGE LAP 18X18 RF (DISPOSABLE) ×3 IMPLANT
STAPLER VISISTAT 35W (STAPLE) IMPLANT
STOCKINETTE IMPERVIOUS LG (DRAPES) IMPLANT
SUCTION FRAZIER HANDLE 10FR (MISCELLANEOUS) ×2
SUCTION TUBE FRAZIER 10FR DISP (MISCELLANEOUS) ×1 IMPLANT
SUT ETHILON 3 0 PS 1 (SUTURE) ×3 IMPLANT
SUT VIC AB 0 CT1 27 (SUTURE)
SUT VIC AB 0 CT1 27XBRD ANBCTR (SUTURE) IMPLANT
SUT VIC AB 2-0 CT1 27 (SUTURE)
SUT VIC AB 2-0 CT1 TAPERPNT 27 (SUTURE) IMPLANT
SYR CONTROL 10ML LL (SYRINGE) IMPLANT
TOWEL GREEN STERILE (TOWEL DISPOSABLE) ×6 IMPLANT
TOWEL GREEN STERILE FF (TOWEL DISPOSABLE) ×6 IMPLANT
TUBE CONNECTING 12'X1/4 (SUCTIONS) ×1
TUBE CONNECTING 12X1/4 (SUCTIONS) ×2 IMPLANT
UNDERPAD 30X36 HEAVY ABSORB (UNDERPADS AND DIAPERS) ×3 IMPLANT
WATER STERILE IRR 1000ML POUR (IV SOLUTION) ×6 IMPLANT
YANKAUER SUCT BULB TIP NO VENT (SUCTIONS) ×3 IMPLANT

## 2019-10-27 NOTE — OR Nursing (Signed)
Care of patient assumed at 9.

## 2019-10-27 NOTE — Progress Notes (Signed)
Orthopedic Tech Progress Note Patient Details:  Kenneth [name] 07-13-1995 [id number] PA Earney Hamburg called requesting short leg splint with stirrups for pt. Assisted PA with wound care. Ortho Devices Type of Ortho Device: Cotton web roll, Short leg splint Ortho Device/Splint Location: RLE Ortho Device/Splint Interventions: Application   Post Interventions Patient Tolerated: Well Instructions Provided: Care of device   Kerry Fort 10/27/2019, 12:13 PM
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
## 2019-10-27 NOTE — Anesthesia Procedure Notes (Signed)
Procedure Name: LMA Insertion Date/Time: 10/27/2019 6:33 PM Performed by: Dairl Ponder, CRNA Pre-anesthesia Checklist: Patient identified, Emergency Drugs available, Suction available, Patient being monitored and Timeout performed Patient Re-evaluated:Patient Re-evaluated prior to induction Oxygen Delivery Method: Circle system utilized Preoxygenation: Pre-oxygenation with 100% oxygen Induction Type: IV induction LMA: LMA inserted LMA Size: 4.0 Number of attempts: 1 Placement Confirmation: positive ETCO2 and breath sounds checked- equal and bilateral Tube secured with: Tape Dental Injury: Teeth and Oropharynx as per pre-operative assessment

## 2019-10-27 NOTE — Transfer of Care (Signed)
Immediate Anesthesia Transfer of Care Note  Patient: Kenneth [name]  Procedure(s) Performed: EXTERNAL FIXATION LEG (Right Ankle)  Patient Location: PACU  Anesthesia Type:General  Level of Consciousness: drowsy  Airway & Oxygen Therapy: Patient Spontanous Breathing  Post-op Assessment: Report given to RN and Post -op Vital signs reviewed and stable  Post vital signs: Reviewed and stable  Last Vitals:  Vitals Value Taken Time  BP 153/97 10/27/19 2009  Temp    Pulse 103 10/27/19 2012  Resp 15 10/27/19 2012  SpO2 99 % 10/27/19 2012  Vitals shown include unvalidated device data.  Last Pain:  Vitals:   10/27/19 1705  TempSrc:   PainSc: 10-Worst pain ever      Patients Stated Pain Goal: 3 (10/27/19 1705)  Complications: No complications documented.
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
## 2019-10-27 NOTE — Plan of Care (Signed)

## 2019-10-27 NOTE — ED Notes (Signed)
Pt being taken to [facility]
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
## 2019-10-28 ENCOUNTER — Inpatient Hospital Stay (HOSPITAL_COMMUNITY): Payer: Self-pay

## 2019-10-28 ENCOUNTER — Encounter (HOSPITAL_COMMUNITY): Payer: Self-pay | Admitting: Orthopedic Surgery

## 2019-10-28 LAB — CBC
HCT: 38.4 % — ABNORMAL LOW (ref 39.0–52.0)
Hemoglobin: 12.4 g/dL — ABNORMAL LOW (ref 13.0–17.0)
MCH: 30.5 pg (ref 26.0–34.0)
MCHC: 32.3 g/dL (ref 30.0–36.0)
MCV: 94.3 fL (ref 80.0–100.0)
Platelets: 173 10*3/uL (ref 150–400)
RBC: 4.07 MIL/uL — ABNORMAL LOW (ref 4.22–5.81)
RDW: 12.4 % (ref 11.5–15.5)
WBC: 22.9 10*3/uL — ABNORMAL HIGH (ref 4.0–10.5)
nRBC: 0 % (ref 0.0–0.2)

## 2019-10-28 LAB — BASIC METABOLIC PANEL
Anion gap: 9 (ref 5–15)
BUN: 10 mg/dL (ref 6–20)
CO2: 28 mmol/L (ref 22–32)
Calcium: 9.4 mg/dL (ref 8.9–10.3)
Chloride: 99 mmol/L (ref 98–111)
Creatinine, Ser: 1.27 mg/dL — ABNORMAL HIGH (ref 0.61–1.24)
GFR calc Af Amer: >60 mL/min (ref >60)
GFR calc non Af Amer: >60 mL/min (ref >60)
Glucose, Bld: 127 mg/dL — ABNORMAL HIGH (ref 70–99)
Potassium: 4.8 mmol/L (ref 3.5–5.1)
Sodium: 136 mmol/L (ref 135–145)

## 2019-10-28 LAB — SURGICAL PCR SCREEN
MRSA, PCR: NEGATIVE
Staphylococcus aureus: POSITIVE — AB

## 2019-10-28 MED ORDER — MUPIROCIN 2 % EX OINT
1.0000 "application " | TOPICAL_OINTMENT | Freq: Two times a day (BID) | CUTANEOUS | Status: DC
  Administered 2019-10-29: 1 via NASAL

## 2019-10-28 NOTE — Anesthesia Preprocedure Evaluation (Addendum)
Anesthesia Evaluation  Patient identified by MRN, date of birth, ID band Patient awake    Reviewed: Allergy & Precautions, NPO status , Patient's Chart, lab work & pertinent test results  Airway Mallampati: II  TM Distance: >3 FB Neck ROM: Full    Dental  (+) Teeth Intact, Dental Advisory Given   Pulmonary neg pulmonary ROS,    Pulmonary exam normal breath sounds clear to auscultation       Cardiovascular Exercise Tolerance: Good negative cardio ROS Normal cardiovascular exam Rhythm:Regular Rate:Normal     Neuro/Psych negative neurological ROS     GI/Hepatic negative GI ROS, Neg liver ROS,   Endo/Other  negative endocrine ROS  Renal/GU negative Renal ROS     Musculoskeletal Right open pilon fracture   Abdominal   Peds  Hematology  (+) Blood dyscrasia, anemia ,   Anesthesia Other Findings Day of surgery medications reviewed with the patient.  Reproductive/Obstetrics                            Anesthesia Physical Anesthesia Plan  ASA: II  Anesthesia Plan: General   Post-op Pain Management:    Induction: Intravenous  PONV Risk Score and Plan: 3 and Midazolam, Dexamethasone and Ondansetron  Airway Management Planned: Oral ETT  Additional Equipment:   Intra-op Plan:   Post-operative Plan: Extubation in OR  Informed Consent: I have reviewed the patients History and Physical, chart, labs and discussed the procedure including the risks, benefits and alternatives for the proposed anesthesia with the patient or authorized representative who has indicated his/her understanding and acceptance.     Dental advisory given  Plan Discussed with: CRNA  Anesthesia Plan Comments:        Anesthesia Quick Evaluation

## 2019-10-28 NOTE — Plan of Care (Signed)
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
## 2019-10-28 NOTE — Plan of Care (Signed)
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
## 2019-10-29 ENCOUNTER — Inpatient Hospital Stay (HOSPITAL_COMMUNITY): Payer: Self-pay | Admitting: Certified Registered Nurse Anesthetist

## 2019-10-29 ENCOUNTER — Inpatient Hospital Stay (HOSPITAL_COMMUNITY): Payer: Self-pay

## 2019-10-29 ENCOUNTER — Encounter (HOSPITAL_COMMUNITY)
Admission: EM | Disposition: A | Discharge status: Home / Self Care | Payer: Self-pay | Source: Home / Self Care | Attending: Orthopedic Surgery

## 2019-10-29 HISTORY — PX: OPEN REDUCTION INTERNAL FIXATION (ORIF) TIBIA/FIBULA FRACTURE: SHX5992

## 2019-10-29 LAB — CBC
HCT: 32.8 % — ABNORMAL LOW (ref 39.0–52.0)
Hemoglobin: 11.1 g/dL — ABNORMAL LOW (ref 13.0–17.0)
MCH: 31.7 pg (ref 26.0–34.0)
MCHC: 33.8 g/dL (ref 30.0–36.0)
MCV: 93.7 fL (ref 80.0–100.0)
Platelets: 173 10*3/uL (ref 150–400)
RBC: 3.5 MIL/uL — ABNORMAL LOW (ref 4.22–5.81)
RDW: 12.7 % (ref 11.5–15.5)
WBC: 20.5 10*3/uL — ABNORMAL HIGH (ref 4.0–10.5)
nRBC: 0 % (ref 0.0–0.2)

## 2019-10-29 SURGERY — OPEN REDUCTION INTERNAL FIXATION (ORIF) TIBIA/FIBULA FRACTURE
Anesthesia: General | Site: Foot | Laterality: Right

## 2019-10-29 MED ORDER — VANCOMYCIN HCL 1000 MG IV SOLR
INTRAVENOUS | Status: AC
  Filled 2019-10-29: qty 1000

## 2019-10-29 MED ORDER — FENTANYL CITRATE (PF) 250 MCG/5ML IJ SOLN
INTRAMUSCULAR | Status: DC | PRN
  Administered 2019-10-29 (×3): 50 ug via INTRAVENOUS

## 2019-10-29 MED ORDER — PHENYLEPHRINE HCL (PRESSORS) 10 MG/ML IV SOLN
INTRAVENOUS | Status: DC | PRN
  Administered 2019-10-29 (×4): 80 ug via INTRAVENOUS

## 2019-10-29 MED ORDER — GLYCOPYRROLATE 0.2 MG/ML IJ SOLN
INTRAMUSCULAR | Status: AC
  Filled 2019-10-29: qty 1

## 2019-10-29 MED ORDER — CHLORHEXIDINE GLUCONATE CLOTH 2 % EX PADS: 6.0000 | MEDICATED_PAD | Freq: Every day | CUTANEOUS | Status: DC

## 2019-10-29 MED ORDER — PHENYLEPHRINE HCL-NACL 10-0.9 MG/250ML-% IV SOLN
INTRAVENOUS | Status: DC | PRN
  Administered 2019-10-29: 25 ug/min via INTRAVENOUS

## 2019-10-29 MED ORDER — DEXMEDETOMIDINE (PRECEDEX) IN NS 20 MCG/5ML (4 MCG/ML) IV SYRINGE
PREFILLED_SYRINGE | INTRAVENOUS | Status: DC | PRN
  Administered 2019-10-29 (×4): 4 ug via INTRAVENOUS

## 2019-10-29 MED ORDER — SUGAMMADEX SODIUM 200 MG/2ML IV SOLN
INTRAVENOUS | Status: DC | PRN
  Administered 2019-10-29: 150 mg via INTRAVENOUS

## 2019-10-29 MED ORDER — CEFAZOLIN SODIUM-DEXTROSE 2-4 GM/100ML-% IV SOLN: 2.0000 g | Freq: Three times a day (TID) | INTRAVENOUS | Status: DC

## 2019-10-29 MED ORDER — ENOXAPARIN SODIUM 40 MG/0.4ML ~~LOC~~ SOLN: 40.0000 mg | SUBCUTANEOUS | Status: DC

## 2019-10-29 MED ORDER — CLONIDINE HCL (ANALGESIA) 100 MCG/ML EP SOLN
EPIDURAL | Status: DC | PRN
  Administered 2019-10-29: 50 ug

## 2019-10-29 MED ORDER — 0.9 % SODIUM CHLORIDE (POUR BTL) OPTIME
TOPICAL | Status: DC | PRN
  Administered 2019-10-29: 1000 mL

## 2019-10-29 MED ORDER — ROCURONIUM BROMIDE 10 MG/ML (PF) SYRINGE
PREFILLED_SYRINGE | INTRAVENOUS | Status: DC | PRN
  Administered 2019-10-29: 60 mg via INTRAVENOUS

## 2019-10-29 MED ORDER — FENTANYL CITRATE (PF) 100 MCG/2ML IJ SOLN: 25.0000 ug | INTRAMUSCULAR | Status: DC | PRN

## 2019-10-29 MED ORDER — DEXAMETHASONE SODIUM PHOSPHATE 10 MG/ML IJ SOLN
INTRAMUSCULAR | Status: DC | PRN
  Administered 2019-10-29: 5 mg via INTRAVENOUS

## 2019-10-29 MED ORDER — PROMETHAZINE HCL 25 MG/ML IJ SOLN: 6.2500 mg | INTRAMUSCULAR | Status: DC | PRN

## 2019-10-29 MED ORDER — MIDAZOLAM HCL 5 MG/5ML IJ SOLN
INTRAMUSCULAR | Status: DC | PRN
  Administered 2019-10-29: 2 mg via INTRAVENOUS

## 2019-10-29 MED ORDER — LIDOCAINE 2% (20 MG/ML) 5 ML SYRINGE
INTRAMUSCULAR | Status: DC | PRN
  Administered 2019-10-29: 60 mg via INTRAVENOUS

## 2019-10-29 MED ORDER — CEFAZOLIN SODIUM-DEXTROSE 2-4 GM/100ML-% IV SOLN
INTRAVENOUS | Status: AC
  Filled 2019-10-29: qty 100

## 2019-10-29 MED ORDER — ACETAMINOPHEN 500 MG PO TABS: 1000.0000 mg | ORAL_TABLET | Freq: Once | ORAL | Status: DC | PRN

## 2019-10-29 MED ORDER — ACETAMINOPHEN 10 MG/ML IV SOLN: 1000.0000 mg | Freq: Once | INTRAVENOUS | Status: DC | PRN

## 2019-10-29 MED ORDER — OXYCODONE HCL 5 MG/5ML PO SOLN: 5.0000 mg | Freq: Once | ORAL | Status: DC | PRN

## 2019-10-29 MED ORDER — BUPIVACAINE-EPINEPHRINE (PF) 0.5% -1:200000 IJ SOLN
INTRAMUSCULAR | Status: DC | PRN
Start: 2019-10-29 — End: 2019-10-29
  Administered 2019-10-29: 10 mL via PERINEURAL
  Administered 2019-10-29: 30 mL via PERINEURAL

## 2019-10-29 MED ORDER — ACETAMINOPHEN 160 MG/5ML PO SOLN: 1000.0000 mg | Freq: Once | ORAL | Status: DC | PRN

## 2019-10-29 MED ORDER — OXYCODONE HCL 5 MG PO TABS: 5.0000 mg | ORAL_TABLET | ORAL | 0 refills | Status: DC | PRN

## 2019-10-29 MED ORDER — ACETAMINOPHEN 325 MG PO TABS: 650.0000 mg | ORAL_TABLET | Freq: Four times a day (QID) | ORAL | Status: DC

## 2019-10-29 MED ORDER — METHOCARBAMOL 500 MG PO TABS: 500.0000 mg | ORAL_TABLET | Freq: Four times a day (QID) | ORAL | 0 refills | Status: AC | PRN

## 2019-10-29 MED ORDER — LACTATED RINGERS IV SOLN: INTRAVENOUS | Status: DC | PRN

## 2019-10-29 MED ORDER — PROPOFOL 10 MG/ML IV BOLUS
INTRAVENOUS | Status: AC
  Filled 2019-10-29: qty 40

## 2019-10-29 MED ORDER — VANCOMYCIN HCL 1000 MG IV SOLR
INTRAVENOUS | Status: DC | PRN
  Administered 2019-10-29: 1000 mg

## 2019-10-29 MED ORDER — OXYCODONE HCL 5 MG PO TABS: 5.0000 mg | ORAL_TABLET | Freq: Once | ORAL | Status: DC | PRN

## 2019-10-29 MED ORDER — MUPIROCIN 2 % EX OINT
1.0000 "application " | TOPICAL_OINTMENT | Freq: Two times a day (BID) | CUTANEOUS | Status: DC
  Filled 2019-10-29 (×2): qty 22

## 2019-10-29 MED ORDER — ASPIRIN EC 325 MG PO TBEC
325.0000 mg | DELAYED_RELEASE_TABLET | Freq: Two times a day (BID) | ORAL | 0 refills | Status: AC
Start: 2019-10-29 — End: 2019-11-28

## 2019-10-29 MED ORDER — ESMOLOL HCL 100 MG/10ML IV SOLN
INTRAVENOUS | Status: DC | PRN
Start: 2019-10-29 — End: 2019-10-29
  Administered 2019-10-29: 20 mg via INTRAVENOUS

## 2019-10-29 MED ORDER — CEFAZOLIN SODIUM-DEXTROSE 2-3 GM-%(50ML) IV SOLR
INTRAVENOUS | Status: DC | PRN
Start: 2019-10-29 — End: 2019-10-29
  Administered 2019-10-29: 2 g via INTRAVENOUS

## 2019-10-29 MED ORDER — ONDANSETRON HCL 4 MG/2ML IJ SOLN
INTRAMUSCULAR | Status: DC | PRN
  Administered 2019-10-29: 4 mg via INTRAVENOUS

## 2019-10-29 MED ORDER — MIDAZOLAM HCL 2 MG/2ML IJ SOLN
INTRAMUSCULAR | Status: AC
  Filled 2019-10-29: qty 2

## 2019-10-29 MED ORDER — FENTANYL CITRATE (PF) 250 MCG/5ML IJ SOLN
INTRAMUSCULAR | Status: AC
  Filled 2019-10-29: qty 5

## 2019-10-29 MED ORDER — HYDRALAZINE HCL 10 MG PO TABS: 10.0000 mg | ORAL_TABLET | Freq: Four times a day (QID) | ORAL | Status: DC | PRN

## 2019-10-29 MED ORDER — PROPOFOL 10 MG/ML IV BOLUS
INTRAVENOUS | Status: DC | PRN
  Administered 2019-10-29: 150 mg via INTRAVENOUS

## 2019-10-29 MED FILL — METHOCARBAMOL 500 MG TABS: 500 | 7 days supply | Qty: 28 | Fill #0

## 2019-10-29 MED FILL — ASPIRIN EC 325 MG TABLET: 325 | 50 days supply | Qty: 100 | Fill #0

## 2019-10-29 MED FILL — oxyCODONE HCL 5 MG TABS: 5 | 5 days supply | Qty: 60 | Fill #0

## 2019-10-29 SURGICAL SUPPLY — 78 items
BANDAGE ESMARK 6X9 LF (GAUZE/BANDAGES/DRESSINGS) ×1 IMPLANT
BIT DRILL 2.5X2.75 QC CALB (BIT) ×3 IMPLANT
BIT DRILL CALIBRATED 2.7 (BIT) ×2 IMPLANT
BIT DRILL CALIBRATED 2.7MM (BIT) ×1
BNDG COHESIVE 4X5 TAN STRL (GAUZE/BANDAGES/DRESSINGS) ×3 IMPLANT
BNDG ELASTIC 4X5.8 VLCR STR LF (GAUZE/BANDAGES/DRESSINGS) ×3 IMPLANT
BNDG ELASTIC 6X5.8 VLCR STR LF (GAUZE/BANDAGES/DRESSINGS) ×3 IMPLANT
BNDG ESMARK 6X9 LF (GAUZE/BANDAGES/DRESSINGS) ×3
BRUSH SCRUB EZ PLAIN DRY (MISCELLANEOUS) ×6 IMPLANT
CHLORAPREP W/TINT 26 (MISCELLANEOUS) ×6 IMPLANT
COVER SURGICAL LIGHT HANDLE (MISCELLANEOUS) ×3 IMPLANT
COVER WAND RF STERILE (DRAPES) ×3 IMPLANT
DRAPE C-ARM 42X72 X-RAY (DRAPES) ×3 IMPLANT
DRAPE C-ARMOR (DRAPES) ×3 IMPLANT
DRAPE ORTHO SPLIT 77X108 STRL (DRAPES) ×4
DRAPE SURG ORHT 6 SPLT 77X108 (DRAPES) ×2 IMPLANT
DRAPE U-SHAPE 47X51 STRL (DRAPES) ×3 IMPLANT
DRSG ADAPTIC 3X8 NADH LF (GAUZE/BANDAGES/DRESSINGS) IMPLANT
DRSG MEPITEL 4X7.2 (GAUZE/BANDAGES/DRESSINGS) ×3 IMPLANT
ELECT REM PT RETURN 9FT ADLT (ELECTROSURGICAL) ×3
ELECTRODE REM PT RTRN 9FT ADLT (ELECTROSURGICAL) ×1 IMPLANT
GAUZE SPONGE 4X4 12PLY STRL (GAUZE/BANDAGES/DRESSINGS) IMPLANT
GAUZE SPONGE 4X4 12PLY STRL LF (GAUZE/BANDAGES/DRESSINGS) ×3 IMPLANT
GLOVE BIO SURGEON STRL SZ 6.5 (GLOVE) ×6 IMPLANT
GLOVE BIO SURGEON STRL SZ7.5 (GLOVE) ×9 IMPLANT
GLOVE BIO SURGEONS STRL SZ 6.5 (GLOVE) ×3
GLOVE BIOGEL PI IND STRL 6.5 (GLOVE) ×1 IMPLANT
GLOVE BIOGEL PI IND STRL 7.5 (GLOVE) ×1 IMPLANT
GLOVE BIOGEL PI INDICATOR 6.5 (GLOVE) ×2
GLOVE BIOGEL PI INDICATOR 7.5 (GLOVE) ×2
GOWN STRL REUS W/ TWL LRG LVL3 (GOWN DISPOSABLE) ×2 IMPLANT
GOWN STRL REUS W/TWL LRG LVL3 (GOWN DISPOSABLE) ×4
K-WIRE ACE 1.6X6 (WIRE) ×9
KIT TURNOVER KIT B (KITS) ×3 IMPLANT
KWIRE ACE 1.6X6 (WIRE) ×3 IMPLANT
MANIFOLD NEPTUNE II (INSTRUMENTS) ×3 IMPLANT
NEEDLE HYPO 21X1.5 SAFETY (NEEDLE) IMPLANT
NEEDLE HYPO 25GX1X1/2 BEV (NEEDLE) ×3 IMPLANT
NS IRRIG 1000ML POUR BTL (IV SOLUTION) ×3 IMPLANT
PACK TOTAL JOINT (CUSTOM PROCEDURE TRAY) ×3 IMPLANT
PAD ABD 8X10 STRL (GAUZE/BANDAGES/DRESSINGS) ×6 IMPLANT
PAD ARMBOARD 7.5X6 YLW CONV (MISCELLANEOUS) ×6 IMPLANT
PAD CAST 4YDX4 CTTN HI CHSV (CAST SUPPLIES) ×1 IMPLANT
PADDING CAST COTTON 4X4 STRL (CAST SUPPLIES) ×2
PADDING CAST COTTON 6X4 STRL (CAST SUPPLIES) ×3 IMPLANT
PLATE 9H RT DIST ANTLAT TIB (Plate) ×2 IMPLANT
PLATE ANTLAT CNTR NAR 156X9 (Plate) ×1 IMPLANT
PLATE TUB 100DEG 5 HO (Plate) ×3 IMPLANT
SCREW CORT FT 32X3.5XNONLOCK (Screw) ×2 IMPLANT
SCREW CORTICAL 3.5MM  28MM (Screw) ×2 IMPLANT
SCREW CORTICAL 3.5MM  30MM (Screw) ×8 IMPLANT
SCREW CORTICAL 3.5MM  32MM (Screw) ×4 IMPLANT
SCREW CORTICAL 3.5MM 28MM (Screw) ×1 IMPLANT
SCREW CORTICAL 3.5MM 30MM (Screw) ×4 IMPLANT
SCREW CORTICAL 3.5MM 38MM (Screw) ×3 IMPLANT
SCREW LOCK CORT STAR 3.5X42 (Screw) ×3 IMPLANT
SCREW LOCK CORT STAR 3.5X44 (Screw) ×3 IMPLANT
SCREW LOCK CORT STAR 3.5X48 (Screw) ×3 IMPLANT
SCREW LOW PROFILE 3.5X30MM TIS (Screw) ×3 IMPLANT
SCREW T15 LP CORT 3.5X46MM NS (Screw) ×3 IMPLANT
SCREW T15 MD 3.5X42MM NS (Screw) ×3 IMPLANT
SPLINT PLASTER CAST XFAST 5X30 (CAST SUPPLIES) ×1 IMPLANT
SPLINT PLASTER XFAST SET 5X30 (CAST SUPPLIES) ×2
SPONGE LAP 18X18 RF (DISPOSABLE) IMPLANT
STAPLER VISISTAT 35W (STAPLE) ×3 IMPLANT
SUCTION FRAZIER HANDLE 10FR (MISCELLANEOUS) ×2
SUCTION TUBE FRAZIER 10FR DISP (MISCELLANEOUS) ×1 IMPLANT
SUT ETHILON 3 0 PS 1 (SUTURE) ×12 IMPLANT
SUT PROLENE 0 CT (SUTURE) IMPLANT
SUT VIC AB 0 CT1 27 (SUTURE) ×2
SUT VIC AB 0 CT1 27XBRD ANBCTR (SUTURE) ×1 IMPLANT
SUT VIC AB 2-0 CT1 27 (SUTURE) ×6
SUT VIC AB 2-0 CT1 TAPERPNT 27 (SUTURE) ×3 IMPLANT
SYR CONTROL 10ML LL (SYRINGE) ×3 IMPLANT
TOWEL GREEN STERILE (TOWEL DISPOSABLE) ×6 IMPLANT
TOWEL GREEN STERILE FF (TOWEL DISPOSABLE) ×3 IMPLANT
UNDERPAD 30X36 HEAVY ABSORB (UNDERPADS AND DIAPERS) ×3 IMPLANT
WATER STERILE IRR 1000ML POUR (IV SOLUTION) ×3 IMPLANT

## 2019-10-29 NOTE — TOC CAGE-AID Note (Signed)
Transition of Care (TOC) - CAGE-AID Screening   Patient Details  Name: Kenneth [name] MRN: [id number] Date of Birth: 02/09/1996  Transition of Care (TOC) CM/SW Contact:    Miranda  Gainey, LCSWA Phone Number: 10/29/2019, 4:14 PM   Clinical Narrative:  CSW met with pt at bedside. CSW introduced self and explained her role at the hospital.  Pt denied alcohol use and substance use. Pt did not need resources at this time.   CAGE-AID Screening:    Have You Ever Felt You Ought to Cut Down on Your Drinking or Drug Use?: No Have People Annoyed You By Critizing Your Drinking Or Drug Use?: No Have You Felt Bad Or Guilty About Your Drinking Or Drug Use?: No Have You Ever Had a Drink or Used Drugs First Thing In The Morning to Steady Your Nerves or to Get Rid of a Hangover?: No CAGE-AID Score: 0  Substance Abuse Education Offered: Yes       Miranda Gainey, LCSWA, LCASA Clinical Social Worker [phone]    
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
## 2019-10-29 NOTE — Progress Notes (Signed)
Orthopedic Tech Progress Note Patient Details:  Kenneth [name] Apr 03, 1995 [id number]  Ortho Devices Type of Ortho Device: Crutches Ortho Device/Splint Location: RLE Ortho Device/Splint Interventions: Adjustment   Post Interventions Patient Tolerated: Well Instructions Provided: Poper ambulation with device   Gerald Stabs 10/29/2019, 3:58 PM
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
## 2019-10-29 NOTE — Discharge Instructions (Signed)
Truitt Merle, MD Ulyses Southward PA-C [facility] [address] [phone] Jani Files)   [phone] (fax)                                  POST-OPERATIVE INSTRUCTIONS   WEIGHT BEARING STATUS: Non-weightbearing right leg  RANGE OF MOTION/ACTIVITY: Okay for knee motion. Do not remove splint  WOUND CARE ? Please keep splint clean dry and intact until follow-up. If your splint gets wet for any reason please contact the office immediately.  Do not stick anything down your splint such as pencils, momey, hangers to try and scratch yourself.  If you feel itchy take Benadryl as prescribed on the bottle for itching ? You may shower on Post-Op Day #2.  ? You must keep splint dry during this process and may find that a plastic bag taped around the extremity or alternatively a towel based bath may be a better option.   ? If you get your splint wet or if it is damaged please contact our clinic.  EXERCISES ? Due to your splint being in place you will not be able to bear weight through your extremity.   ? DO NOT PUT ANY WEIGHT ON YOUR OPERATIVE LEG ? Please use crutches or a walker to avoid weight bearing.   DVT/PE prophylaxis: Aspirin 325 mg twice daily  DIET: As you were eating previously.  Can use over the counter stool softeners and bowel preparations, such as Miralax, to help with bowel movements.  Narcotics can be constipating.  Be sure to drink plenty of fluids  REGIONAL ANESTHESIA (NERVE BLOCKS)  The anesthesia team may have performed a nerve block for you if safe in the setting of your care.  This is a great tool used to minimize pain.  Typically the block may start wearing off overnight but the long acting medicine may last for 3-4 days.  The nerve block wearing off can be a challenging period but please utilize your as needed pain medications to try and manage this period.    ICE AND ELEVATE INJURED/OPERATIVE EXTREMITY  Using ice and elevating the injured  extremity above your heart can help with swelling and pain control.  Icing in a pulsatile fashion, such as 20 minutes on and 20 minutes off, can be followed.    Do not place ice directly on skin. Make sure there is a barrier between to skin and the ice pack.    Using frozen items such as frozen peas works well as the conform nicely to the are that needs to be iced.  POST-OP MEDICATIONS- Multimodal approach to pain control  In general your pain will be controlled with a combination of substances.  Prescriptions unless otherwise discussed are electronically sent to your pharmacy.  This is a carefully made plan we use to minimize narcotic use.                     -   Acetaminophen - Non-narcotic pain medicine taken on a scheduled basis        -   Oxycodone - This is a strong narcotic, to be used only on an "as needed" basis for pain.                  -   Robaxin -  Muscle relaxer taken on an "as needed" basis for muscle spasms                  -  Gabapentin - Helps with nerve pain, such as burning or tingling feeling in the operative arm             STOP SMOKING OR USING NICOTINE PRODUCTS!!!!  As discussed nicotine severely impairs your body's ability to heal surgical and traumatic wounds but also impairs bone healing.  Wounds and bone heal by forming microscopic blood vessels (angiogenesis) and nicotine is a vasoconstrictor (essentially, shrinks blood vessels).  Therefore, if vasoconstriction occurs to these microscopic blood vessels they essentially disappear and are unable to deliver necessary nutrients to the healing tissue.  This is one modifiable factor that you can do to dramatically increase your chances of healing your injury.    (This means no smoking, no nicotine gum, patches, etc)   FOLLOW-UP ? If you develop a Fever (>101.5), Redness or Drainage from the surgical incision site, please call our office to arrange for an evaluation. ? Please call the office to schedule a follow-up  appointment for your incision check if you do not already have one, 7-10 days post-operatively.  CALL THE OFFICE WITH ANY QUESTIONS OR CONCERNS: [phone]   VISIT OUR WEBSITE FOR ADDITIONAL INFORMATION: [email or web address]    OTHER HELPFUL INFORMATION   If you had a block, it will wear off between 8-24 hrs postop typically.  This is period when your pain may go from nearly zero to the pain you would have had postop without the block.  This is an abrupt transition but nothing dangerous is happening.  You may take an extra dose of narcotic when this happens.   You may be more comfortable sleeping in a semi-seated position the first few nights following surgery.  Keep a pillow propped under the elbow and forearm for comfort.  If you have a recliner type of chair it might be beneficial.  If not that is fine too, but it would be helpful to sleep propped up with pillows behind your operated shoulder as well under your elbow and forearm.  This will reduce pulling on the suture lines.   When dressing, put your operative arm in the sleeve first.  When getting undressed, take your operative arm out last.  Loose fitting, button-down shirts are recommended.  Often in the first days after surgery you may be more comfortable keeping your operative arm under your shirt and not through the sleeve.   You may return to work/school in the next couple of days when you feel up to it.  Desk work and typing in the sling is fine.   We suggest you use the pain medication the first night prior to going to bed, in order to ease any pain when the anesthesia wears off. You should avoid taking pain medications on an empty stomach as it will make you nauseous.  You should wean off your narcotic medicines as soon as you are able.  Most patients will be off or using minimal narcotics before their first postop appointment.    Do not drink alcoholic beverages or take illicit drugs when taking pain medications.   It is  against the law to drive while taking narcotics.  In some states it is against the law to drive while your arm is in a sling.    Pain medication may make you constipated.  Below are a few solutions to try in this order:  Decrease the amount of pain medication if you aren't having pain.  Drink lots of decaffeinated fluids.  Drink prune juice and/or each dried prunes  If the first 3 don't work start with additional solutions -   Take Colace - an over-the-counter stool softener -   Take Senokot - an over-the-counter laxative -   Take Miralax - a stronger over-the-counter laxative

## 2019-10-29 NOTE — Anesthesia Postprocedure Evaluation (Signed)
Anesthesia Post Note  Patient: Jousha Schwandt Osbourn  Procedure(s) Performed: OPEN REDUCTION INTERNAL FIXATION (ORIF) PILON (Right Foot)     Patient location during evaluation: PACU Anesthesia Type: General Level of consciousness: awake and alert, oriented and awake Pain management: pain level controlled Vital Signs Assessment: post-procedure vital signs reviewed and stable Respiratory status: spontaneous breathing, nonlabored ventilation, respiratory function stable and patient connected to nasal cannula oxygen Cardiovascular status: blood pressure returned to baseline and stable Postop Assessment: no apparent nausea or vomiting Anesthetic complications: no   No complications documented.  Last Vitals:  Vitals:   10/29/19 1105 10/29/19 1120  BP: 130/83 136/84  Pulse: 74 67  Resp: 11   Temp:  36.6 C  SpO2: 100% 100%    Last Pain:  Vitals:   10/29/19 1120  TempSrc: Oral  PainSc: Asleep        RLE Motor Response: No movement due to regional block (10/29/19 1130) RLE Sensation: Decreased;Numbness (10/29/19 1130)      Cecile Hearing
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
## 2019-10-29 NOTE — Anesthesia Procedure Notes (Signed)
Procedure Name: Intubation Date/Time: 10/29/2019 7:37 AM Performed by: Glynda Jaeger, CRNA Pre-anesthesia Checklist: Patient identified, Patient being monitored, Timeout performed, Emergency Drugs available and Suction available Patient Re-evaluated:Patient Re-evaluated prior to induction Oxygen Delivery Method: Circle System Utilized Preoxygenation: Pre-oxygenation with 100% oxygen Induction Type: IV induction Ventilation: Mask ventilation without difficulty Laryngoscope Size: Mac and 4 Grade View: Grade II Tube type: Oral Tube size: 7.5 mm Number of attempts: 1 Airway Equipment and Method: Stylet Placement Confirmation: ETT inserted through vocal cords under direct vision,  positive ETCO2 and breath sounds checked- equal and bilateral Secured at: 23 cm Tube secured with: Tape Dental Injury: Teeth and Oropharynx as per pre-operative assessment

## 2019-10-29 NOTE — Progress Notes (Signed)
Pt given discharge instructions and gone over with him. He verbalized understanding. All questions answered. Crutches delivered to room to take home. All pt belongings gathered. Pt waiting on girlfriend to pick him up.

## 2019-10-29 NOTE — Transfer of Care (Signed)
Immediate Anesthesia Transfer of Care Note  Patient: Kenneth [name]  Procedure(s) Performed: OPEN REDUCTION INTERNAL FIXATION (ORIF) PILON (Right Foot)  Patient Location: PACU  Anesthesia Type:General  Level of Consciousness: awake, patient cooperative and responds to stimulation  Airway & Oxygen Therapy: Patient Spontanous Breathing  Post-op Assessment: Report given to RN and Post -op Vital signs reviewed and stable  Post vital signs: Reviewed and stable  Last Vitals:  Vitals Value Taken Time  BP 132/86 10/29/19 1020  Temp    Pulse 86 10/29/19 1023  Resp 14 10/29/19 1023  SpO2 99 % 10/29/19 1023  Vitals shown include unvalidated device data.  Last Pain:  Vitals:   10/29/19 0408  TempSrc: Oral  PainSc:       Patients Stated Pain Goal: 3 (10/28/19 5945)  Complications: No complications documented.

## 2019-10-30 ENCOUNTER — Encounter (HOSPITAL_COMMUNITY): Payer: Self-pay | Admitting: Student

## 2019-10-30 ENCOUNTER — Encounter: Payer: Self-pay | Admitting: Student

## 2019-10-30 DIAGNOSIS — S82831B Other fracture of upper and lower end of right fibula, initial encounter for open fracture type I or II: Secondary | ICD-10-CM | POA: Insufficient documentation

## 2019-10-30 DIAGNOSIS — W3400XA Accidental discharge from unspecified firearms or gun, initial encounter: Secondary | ICD-10-CM | POA: Insufficient documentation

## 2019-10-30 DIAGNOSIS — M795 Residual foreign body in soft tissue: Secondary | ICD-10-CM | POA: Insufficient documentation

## 2019-10-31 NOTE — Anesthesia Postprocedure Evaluation (Signed)
Anesthesia Post Note  Patient: Kenneth [name]  Procedure(s) Performed: EXTERNAL FIXATION LEG (Right Ankle)     Patient location during evaluation: PACU Anesthesia Type: General Level of consciousness: awake and alert Pain management: pain level controlled Vital Signs Assessment: post-procedure vital signs reviewed and stable Respiratory status: spontaneous breathing, nonlabored ventilation, respiratory function stable and patient connected to nasal cannula oxygen Cardiovascular status: blood pressure returned to baseline and stable Postop Assessment: no apparent nausea or vomiting Anesthetic complications: no   No complications documented.  Last Vitals:  Vitals:   10/29/19 1105 10/29/19 1120  BP: 130/83 136/84  Pulse: 74 67  Resp: 11   Temp:  36.6 C  SpO2: 100% 100%    Last Pain:  Vitals:   10/29/19 1120  TempSrc: Oral  PainSc: Asleep                 Hibo Blasdell

## 2019-11-04 ENCOUNTER — Ambulatory Visit: Payer: Self-pay | Attending: Student | Admitting: Physical Therapy

## 2019-11-04 ENCOUNTER — Encounter: Payer: Self-pay | Admitting: Physical Therapy

## 2019-11-04 ENCOUNTER — Other Ambulatory Visit: Payer: Self-pay

## 2019-11-04 DIAGNOSIS — R262 Difficulty in walking, not elsewhere classified: Secondary | ICD-10-CM | POA: Insufficient documentation

## 2019-11-04 DIAGNOSIS — M25671 Stiffness of right ankle, not elsewhere classified: Secondary | ICD-10-CM | POA: Insufficient documentation

## 2019-11-04 DIAGNOSIS — M25571 Pain in right ankle and joints of right foot: Secondary | ICD-10-CM | POA: Insufficient documentation
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
## 2019-11-11 ENCOUNTER — Ambulatory Visit: Payer: Self-pay

## 2019-11-11 ENCOUNTER — Other Ambulatory Visit: Payer: Self-pay

## 2019-11-11 DIAGNOSIS — R262 Difficulty in walking, not elsewhere classified: Secondary | ICD-10-CM

## 2019-11-11 DIAGNOSIS — M25671 Stiffness of right ankle, not elsewhere classified: Secondary | ICD-10-CM

## 2019-11-11 DIAGNOSIS — M25571 Pain in right ankle and joints of right foot: Secondary | ICD-10-CM
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
## 2019-11-17 ENCOUNTER — Other Ambulatory Visit: Payer: Self-pay

## 2019-11-17 ENCOUNTER — Ambulatory Visit: Payer: Self-pay

## 2019-11-17 DIAGNOSIS — M25571 Pain in right ankle and joints of right foot: Secondary | ICD-10-CM

## 2019-11-17 DIAGNOSIS — M25671 Stiffness of right ankle, not elsewhere classified: Secondary | ICD-10-CM

## 2019-11-17 DIAGNOSIS — R262 Difficulty in walking, not elsewhere classified: Secondary | ICD-10-CM
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
## 2019-11-19 ENCOUNTER — Other Ambulatory Visit: Payer: Self-pay

## 2019-11-19 ENCOUNTER — Ambulatory Visit: Payer: Self-pay | Attending: Student

## 2019-11-19 DIAGNOSIS — M25671 Stiffness of right ankle, not elsewhere classified: Secondary | ICD-10-CM | POA: Insufficient documentation

## 2019-11-19 DIAGNOSIS — R262 Difficulty in walking, not elsewhere classified: Secondary | ICD-10-CM | POA: Insufficient documentation

## 2019-11-19 DIAGNOSIS — M25571 Pain in right ankle and joints of right foot: Secondary | ICD-10-CM | POA: Insufficient documentation
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
## 2019-11-25 ENCOUNTER — Ambulatory Visit: Payer: Self-pay

## 2019-11-25 ENCOUNTER — Other Ambulatory Visit: Payer: Self-pay

## 2019-11-25 DIAGNOSIS — M25571 Pain in right ankle and joints of right foot: Secondary | ICD-10-CM

## 2019-11-25 DIAGNOSIS — R262 Difficulty in walking, not elsewhere classified: Secondary | ICD-10-CM

## 2019-11-25 DIAGNOSIS — M25671 Stiffness of right ankle, not elsewhere classified: Secondary | ICD-10-CM
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
## 2019-11-30 ENCOUNTER — Ambulatory Visit: Payer: Self-pay

## 2019-12-01 ENCOUNTER — Other Ambulatory Visit: Payer: Self-pay

## 2019-12-01 ENCOUNTER — Ambulatory Visit: Payer: Self-pay

## 2019-12-01 DIAGNOSIS — M25671 Stiffness of right ankle, not elsewhere classified: Secondary | ICD-10-CM

## 2019-12-01 DIAGNOSIS — R262 Difficulty in walking, not elsewhere classified: Secondary | ICD-10-CM

## 2019-12-01 DIAGNOSIS — M25571 Pain in right ankle and joints of right foot: Secondary | ICD-10-CM
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
## 2019-12-03 ENCOUNTER — Ambulatory Visit: Payer: Self-pay

## 2019-12-03 ENCOUNTER — Other Ambulatory Visit: Payer: Self-pay

## 2019-12-03 DIAGNOSIS — M25571 Pain in right ankle and joints of right foot: Secondary | ICD-10-CM

## 2019-12-03 DIAGNOSIS — R262 Difficulty in walking, not elsewhere classified: Secondary | ICD-10-CM

## 2019-12-03 DIAGNOSIS — M25671 Stiffness of right ankle, not elsewhere classified: Secondary | ICD-10-CM
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
## 2019-12-07 ENCOUNTER — Encounter: Payer: Self-pay | Admitting: Physical Therapy

## 2019-12-07 ENCOUNTER — Ambulatory Visit: Payer: Self-pay | Admitting: Physical Therapy

## 2019-12-07 ENCOUNTER — Other Ambulatory Visit: Payer: Self-pay

## 2019-12-07 DIAGNOSIS — R262 Difficulty in walking, not elsewhere classified: Secondary | ICD-10-CM

## 2019-12-07 DIAGNOSIS — M25571 Pain in right ankle and joints of right foot: Secondary | ICD-10-CM

## 2019-12-07 DIAGNOSIS — M25671 Stiffness of right ankle, not elsewhere classified: Secondary | ICD-10-CM
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
## 2019-12-10 ENCOUNTER — Other Ambulatory Visit: Payer: Self-pay

## 2019-12-10 ENCOUNTER — Ambulatory Visit: Payer: Self-pay

## 2019-12-10 DIAGNOSIS — R262 Difficulty in walking, not elsewhere classified: Secondary | ICD-10-CM

## 2019-12-10 DIAGNOSIS — M25571 Pain in right ankle and joints of right foot: Secondary | ICD-10-CM

## 2019-12-10 DIAGNOSIS — M25671 Stiffness of right ankle, not elsewhere classified: Secondary | ICD-10-CM
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
## 2019-12-15 ENCOUNTER — Other Ambulatory Visit: Payer: Self-pay

## 2019-12-15 ENCOUNTER — Ambulatory Visit: Payer: Self-pay

## 2019-12-15 DIAGNOSIS — M25671 Stiffness of right ankle, not elsewhere classified: Secondary | ICD-10-CM

## 2019-12-15 DIAGNOSIS — R262 Difficulty in walking, not elsewhere classified: Secondary | ICD-10-CM

## 2019-12-15 DIAGNOSIS — M25571 Pain in right ankle and joints of right foot: Secondary | ICD-10-CM
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
## 2019-12-17 ENCOUNTER — Encounter: Payer: Self-pay | Admitting: Physical Therapy

## 2019-12-17 ENCOUNTER — Other Ambulatory Visit: Payer: Self-pay

## 2019-12-17 ENCOUNTER — Ambulatory Visit: Payer: Self-pay | Admitting: Physical Therapy

## 2019-12-17 DIAGNOSIS — M25571 Pain in right ankle and joints of right foot: Secondary | ICD-10-CM

## 2019-12-17 DIAGNOSIS — R262 Difficulty in walking, not elsewhere classified: Secondary | ICD-10-CM

## 2019-12-17 DIAGNOSIS — M25671 Stiffness of right ankle, not elsewhere classified: Secondary | ICD-10-CM
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
## 2019-12-21 ENCOUNTER — Other Ambulatory Visit: Payer: Self-pay

## 2019-12-21 ENCOUNTER — Ambulatory Visit: Payer: Self-pay | Attending: Student

## 2019-12-21 DIAGNOSIS — M25671 Stiffness of right ankle, not elsewhere classified: Secondary | ICD-10-CM | POA: Insufficient documentation

## 2019-12-21 DIAGNOSIS — R262 Difficulty in walking, not elsewhere classified: Secondary | ICD-10-CM | POA: Insufficient documentation

## 2019-12-21 DIAGNOSIS — M25571 Pain in right ankle and joints of right foot: Secondary | ICD-10-CM | POA: Insufficient documentation
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
## 2019-12-30 ENCOUNTER — Other Ambulatory Visit: Payer: Self-pay

## 2019-12-30 ENCOUNTER — Ambulatory Visit: Payer: Self-pay | Admitting: Physical Therapy

## 2019-12-30 ENCOUNTER — Encounter: Payer: Self-pay | Admitting: Physical Therapy

## 2019-12-30 DIAGNOSIS — M25571 Pain in right ankle and joints of right foot: Secondary | ICD-10-CM

## 2019-12-30 DIAGNOSIS — R262 Difficulty in walking, not elsewhere classified: Secondary | ICD-10-CM

## 2019-12-30 DIAGNOSIS — M25671 Stiffness of right ankle, not elsewhere classified: Secondary | ICD-10-CM
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
## 2020-01-06 ENCOUNTER — Ambulatory Visit: Payer: Self-pay

## 2020-01-07 ENCOUNTER — Encounter: Payer: Self-pay | Admitting: Physical Therapy

## 2020-01-07 ENCOUNTER — Ambulatory Visit: Payer: Self-pay | Admitting: Physical Therapy

## 2020-01-07 ENCOUNTER — Other Ambulatory Visit: Payer: Self-pay

## 2020-01-07 DIAGNOSIS — R262 Difficulty in walking, not elsewhere classified: Secondary | ICD-10-CM

## 2020-01-07 DIAGNOSIS — M25671 Stiffness of right ankle, not elsewhere classified: Secondary | ICD-10-CM

## 2020-01-07 DIAGNOSIS — M25571 Pain in right ankle and joints of right foot: Secondary | ICD-10-CM
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
## 2020-01-13 ENCOUNTER — Ambulatory Visit: Payer: Self-pay

## 2020-01-19 ENCOUNTER — Ambulatory Visit: Payer: Self-pay | Attending: Student

## 2020-01-19 ENCOUNTER — Other Ambulatory Visit: Payer: Self-pay

## 2020-01-19 DIAGNOSIS — R262 Difficulty in walking, not elsewhere classified: Secondary | ICD-10-CM | POA: Insufficient documentation

## 2020-01-19 DIAGNOSIS — M25671 Stiffness of right ankle, not elsewhere classified: Secondary | ICD-10-CM | POA: Insufficient documentation

## 2020-01-19 DIAGNOSIS — M25571 Pain in right ankle and joints of right foot: Secondary | ICD-10-CM | POA: Insufficient documentation
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
## 2020-01-26 ENCOUNTER — Ambulatory Visit: Payer: Self-pay

## 2020-01-29 ENCOUNTER — Ambulatory Visit: Payer: Self-pay | Admitting: Physical Therapy

## 2020-01-29 ENCOUNTER — Encounter: Payer: Self-pay | Admitting: Physical Therapy

## 2020-01-29 ENCOUNTER — Other Ambulatory Visit: Payer: Self-pay

## 2020-01-29 DIAGNOSIS — M25571 Pain in right ankle and joints of right foot: Secondary | ICD-10-CM

## 2020-01-29 DIAGNOSIS — R262 Difficulty in walking, not elsewhere classified: Secondary | ICD-10-CM

## 2020-01-29 DIAGNOSIS — M25671 Stiffness of right ankle, not elsewhere classified: Secondary | ICD-10-CM
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
## 2020-02-16 ENCOUNTER — Other Ambulatory Visit: Payer: Self-pay

## 2020-02-16 ENCOUNTER — Encounter: Payer: Self-pay | Admitting: Physical Therapy

## 2020-02-16 ENCOUNTER — Ambulatory Visit: Payer: Self-pay | Admitting: Physical Therapy

## 2020-02-16 DIAGNOSIS — R262 Difficulty in walking, not elsewhere classified: Secondary | ICD-10-CM

## 2020-02-16 DIAGNOSIS — M25571 Pain in right ankle and joints of right foot: Secondary | ICD-10-CM

## 2020-02-16 DIAGNOSIS — M25671 Stiffness of right ankle, not elsewhere classified: Secondary | ICD-10-CM
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
## 2020-02-18 ENCOUNTER — Ambulatory Visit: Payer: Self-pay | Attending: Student

## 2020-02-23 ENCOUNTER — Ambulatory Visit: Payer: Self-pay

## 2020-02-25 ENCOUNTER — Encounter: Payer: Self-pay | Admitting: Physical Therapy

## 2020-03-01 ENCOUNTER — Encounter: Payer: Self-pay | Admitting: Physical Therapy

## 2020-03-03 ENCOUNTER — Encounter: Payer: Self-pay | Admitting: Physical Therapy

## 2020-03-08 ENCOUNTER — Encounter: Payer: Self-pay | Admitting: Physical Therapy

## 2020-03-10 ENCOUNTER — Encounter: Payer: Self-pay | Admitting: Physical Therapy

## 2021-08-11 ENCOUNTER — Emergency Department (HOSPITAL_BASED_OUTPATIENT_CLINIC_OR_DEPARTMENT_OTHER)
Admission: EM | Admit: 2021-08-11 | Discharge: 2021-08-11 | Disposition: A | Discharge status: Home / Self Care | Payer: Self-pay | Attending: Emergency Medicine | Admitting: Emergency Medicine

## 2021-08-11 ENCOUNTER — Emergency Department (HOSPITAL_BASED_OUTPATIENT_CLINIC_OR_DEPARTMENT_OTHER): Payer: Self-pay

## 2021-08-11 ENCOUNTER — Other Ambulatory Visit: Payer: Self-pay

## 2021-08-11 ENCOUNTER — Encounter (HOSPITAL_BASED_OUTPATIENT_CLINIC_OR_DEPARTMENT_OTHER): Payer: Self-pay | Admitting: Emergency Medicine

## 2021-08-11 DIAGNOSIS — R109 Unspecified abdominal pain: Secondary | ICD-10-CM | POA: Insufficient documentation

## 2021-08-11 LAB — URINALYSIS, ROUTINE W REFLEX MICROSCOPIC
Bilirubin Urine: NEGATIVE
Glucose, UA: NEGATIVE mg/dL
Ketones, ur: NEGATIVE mg/dL
Leukocytes,Ua: NEGATIVE
Nitrite: NEGATIVE
Protein, ur: NEGATIVE mg/dL
Specific Gravity, Urine: <=1.005 (ref 1.005–1.030)
pH: 6 (ref 5.0–8.0)

## 2021-08-11 LAB — URINALYSIS, MICROSCOPIC (REFLEX)

## 2021-08-11 MED ORDER — KETOROLAC TROMETHAMINE 60 MG/2ML IM SOLN
60.0000 mg | Freq: Once | INTRAMUSCULAR | Status: AC
  Administered 2021-08-11: 60 mg via INTRAMUSCULAR
  Filled 2021-08-11: qty 2

## 2021-08-11 NOTE — Discharge Instructions (Signed)
You can take tylenol or ibuprofen available over the counter according to label instructions as needed for pain.   °

## 2021-08-11 NOTE — ED Triage Notes (Signed)
Pt c/o right flank pain starting at 0200. Pt sates earlier this week he had 1 episode of vomiting.

## 2021-08-11 NOTE — ED Provider Notes (Signed)
[facility] EMERGENCY DEPARTMENT Provider Note   CSN: [id number] Arrival date & time: 08/11/21  0459     History  Chief Complaint  Patient presents with   Flank Pain    Kenneth [name] is a 26 y.o. male.  The history is provided by the patient and medical records.  Flank Pain  Kenneth [name] is a 26 y.o. male who presents to the Emergency Department complaining of flank pain.  He presents to the ED for evaluation of sharp right sided flank pain that woke him from sleep. Pain comes and goes.  Vomited once on Tuesday  No fever. No chest pain, sob.  Had a loose BM Wednesday.  No dysuria or change in urine quality.  No numbness/weakness in legs.  No new injuries.     Home Medications Prior to Admission medications   Medication Sig Start Date End Date Taking? Authorizing Provider  methocarbamol (ROBAXIN) 500 MG tablet Take 1 tablet (500 mg total) by mouth every 6 (six) hours as needed for muscle spasms. 10/29/19   [place], PA-C  oxyCODONE (OXY IR/ROXICODONE) 5 MG immediate release tablet Take 1-2 tablets (5-10 mg total) by mouth every 4 (four) hours as needed for severe pain. 10/29/19   [place], PA-C      Allergies    Keflex [cephalexin]    Review of Systems   Review of Systems  Genitourinary:  Positive for flank pain.  All other systems reviewed and are negative.  Physical Exam Updated Vital Signs BP (!) 146/95   Pulse 64   Temp 98.3 F (36.8 C) (Oral)   Resp 16   Ht 5\' 9"  (1.753 m)   Wt 74.8 kg   SpO2 100%   BMI 24.37 kg/m  Physical Exam Vitals and nursing note reviewed.  Constitutional:      Appearance: He is well-developed.  HENT:     Head: Normocephalic and atraumatic.  Cardiovascular:     Rate and Rhythm: Normal rate and regular rhythm.     Heart sounds: No murmur heard. Pulmonary:     Effort: Pulmonary effort is normal. No respiratory distress.     Breath sounds: Normal breath sounds.  Abdominal:     Palpations: Abdomen  is soft.     Tenderness: There is no abdominal tenderness. There is no right CVA tenderness, left CVA tenderness, guarding or rebound.  Musculoskeletal:        General: No swelling or tenderness.     Comments: 2+ DP pulses bilaterally  Skin:    General: Skin is warm and dry.     Findings: No rash.  Neurological:     Mental Status: He is alert and oriented to person, place, and time.     Comments: 5/5 strength in BLE with sensation to light touch intact in BLE.  Psychiatric:        Behavior: Behavior normal.    ED Results / Procedures / Treatments   Labs (all labs ordered are listed, but only abnormal results are displayed) Labs Reviewed  URINALYSIS, ROUTINE W REFLEX MICROSCOPIC - Abnormal; Notable for the following components:      Result Value   Hgb urine dipstick TRACE (*)    All other components within normal limits  URINALYSIS, MICROSCOPIC (REFLEX) - Abnormal; Notable for the following components:   Bacteria, UA RARE (*)    All other components within normal limits    EKG None  Radiology CT Renal Stone Study  Result Date: 08/11/2021  CLINICAL DATA:  Right flank pain with episode of vomiting earlier this week EXAM: CT ABDOMEN AND PELVIS WITHOUT CONTRAST TECHNIQUE: Multidetector CT imaging of the abdomen and pelvis was performed following the standard protocol without IV contrast. RADIATION DOSE REDUCTION: This exam was performed according to the departmental dose-optimization program which includes automated exposure control, adjustment of the mA and/or kV according to patient size and/or use of iterative reconstruction technique. COMPARISON:  None Available. FINDINGS: Lower chest:  No contributory findings. Hepatobiliary: No focal liver abnormality.No evidence of biliary obstruction or stone. Pancreas: Unremarkable. Spleen: Unremarkable. Adrenals/Urinary Tract: Negative adrenals. No hydronephrosis or stone. Unremarkable bladder. Stomach/Bowel:  No obstruction. No appendicitis.  Vascular/Lymphatic: No acute vascular abnormality. No mass or adenopathy. Reproductive:No pathologic findings. Other: No ascites or pneumoperitoneum. Musculoskeletal: No acute abnormalities. IMPRESSION: Negative noncontrast abdominal CT. Electronically Signed   By: Tiburcio Pea M.D.   On: 08/11/2021 06:31    Procedures Procedures    Medications Ordered in ED Medications  ketorolac (TORADOL) injection 60 mg (60 mg Intramuscular Given 08/11/21 0542)    ED Course/ Medical Decision Making/ A&P                           Medical Decision Making Amount and/or Complexity of Data Reviewed Labs: ordered. Radiology: ordered.  Risk Prescription drug management.   Patient here for evaluation of sharp flank pain.  He did have vomiting 1 episode 3 days ago.  No recurrent nausea or vomiting.  No dysuria.  UA not consistent with UTI.  Given description of symptoms a CT stone study was obtained, which is negative for obstructing stone or acute pathology.  Current clinical picture is not consistent with appendicitis, dissection, sepsis.  Discussed with patient home care for back pain, possibly musculoskeletal in origin with OTC analgesics.  Discussed outpatient follow-up and return precautions.         Final Clinical Impression(s) / ED Diagnoses Final diagnoses:  Right flank pain    Rx / DC Orders ED Discharge Orders     None         Tilden Fossa, MD 08/11/21 [phone]
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
## 2021-12-05 ENCOUNTER — Ambulatory Visit: Payer: Self-pay | Admitting: Family Medicine

## 2021-12-20 ENCOUNTER — Ambulatory Visit: Payer: Self-pay | Admitting: Family Medicine

## 2022-01-23 ENCOUNTER — Other Ambulatory Visit (HOSPITAL_BASED_OUTPATIENT_CLINIC_OR_DEPARTMENT_OTHER): Payer: Self-pay

## 2022-01-23 MED ORDER — CELECOXIB 200 MG PO CAPS
200.0000 mg | ORAL_CAPSULE | Freq: Two times a day (BID) | ORAL | 0 refills | Status: AC
  Filled 2022-01-23: qty 30, 15d supply, fill #0

## 2022-03-14 ENCOUNTER — Other Ambulatory Visit (HOSPITAL_BASED_OUTPATIENT_CLINIC_OR_DEPARTMENT_OTHER): Payer: Self-pay

## 2022-03-14 MED ORDER — AZITHROMYCIN 250 MG PO TABS
ORAL_TABLET | ORAL | 0 refills | Status: AC
  Filled 2022-03-14: qty 6, 5d supply, fill #0

## 2022-03-14 MED ORDER — MELOXICAM 15 MG PO TABS
15.0000 mg | ORAL_TABLET | Freq: Every day | ORAL | 2 refills | Status: AC
  Filled 2022-03-14: qty 30, 30d supply, fill #0

## 2022-03-26 ENCOUNTER — Other Ambulatory Visit (HOSPITAL_BASED_OUTPATIENT_CLINIC_OR_DEPARTMENT_OTHER): Payer: Self-pay

## 2022-07-14 ENCOUNTER — Encounter (HOSPITAL_BASED_OUTPATIENT_CLINIC_OR_DEPARTMENT_OTHER): Payer: Self-pay | Admitting: Emergency Medicine

## 2022-07-14 ENCOUNTER — Emergency Department (HOSPITAL_BASED_OUTPATIENT_CLINIC_OR_DEPARTMENT_OTHER): Payer: BC Managed Care – PPO

## 2022-07-14 ENCOUNTER — Other Ambulatory Visit: Payer: Self-pay

## 2022-07-14 ENCOUNTER — Emergency Department (HOSPITAL_BASED_OUTPATIENT_CLINIC_OR_DEPARTMENT_OTHER)
Admission: EM | Admit: 2022-07-14 | Discharge: 2022-07-15 | Disposition: A | Discharge status: Home / Self Care | Payer: BC Managed Care – PPO | Attending: Emergency Medicine | Admitting: Emergency Medicine

## 2022-07-14 DIAGNOSIS — S82455A Nondisplaced comminuted fracture of shaft of left fibula, initial encounter for closed fracture: Secondary | ICD-10-CM | POA: Diagnosis not present

## 2022-07-14 DIAGNOSIS — M7989 Other specified soft tissue disorders: Secondary | ICD-10-CM | POA: Insufficient documentation

## 2022-07-14 DIAGNOSIS — S99912A Unspecified injury of left ankle, initial encounter: Secondary | ICD-10-CM | POA: Diagnosis present

## 2022-07-14 MED ORDER — IBUPROFEN 600 MG PO TABS: 600.0000 mg | ORAL_TABLET | Freq: Four times a day (QID) | ORAL | 0 refills | Status: AC | PRN

## 2022-07-14 MED ORDER — ACETAMINOPHEN 500 MG PO TABS: 500.0000 mg | ORAL_TABLET | Freq: Four times a day (QID) | ORAL | 0 refills | Status: AC | PRN

## 2022-07-14 MED ORDER — HYDROMORPHONE HCL 1 MG/ML IJ SOLN
0.5000 mg | Freq: Once | INTRAMUSCULAR | Status: AC
  Administered 2022-07-15: 0.5 mg via SUBCUTANEOUS
  Filled 2022-07-14: qty 1

## 2022-07-14 MED ORDER — OXYCODONE HCL 5 MG PO TABS
5.0000 mg | ORAL_TABLET | Freq: Once | ORAL | Status: AC
  Administered 2022-07-14: 5 mg via ORAL
  Filled 2022-07-14: qty 1

## 2022-07-14 MED ORDER — OXYCODONE HCL 5 MG PO TABS: 5.0000 mg | ORAL_TABLET | ORAL | 0 refills | Status: AC | PRN

## 2022-07-14 NOTE — ED Notes (Signed)
Pt to XR
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
## 2023-07-17 ENCOUNTER — Ambulatory Visit: Admitting: Family Medicine

## 2023-08-05 ENCOUNTER — Ambulatory Visit: Admitting: Physician Assistant

## 2023-09-26 ENCOUNTER — Ambulatory Visit: Admitting: Internal Medicine
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
## 2023-12-26 ENCOUNTER — Ambulatory Visit: Admitting: Physician Assistant

## 2024-05-12 ENCOUNTER — Ambulatory Visit: Admitting: Student in an Organized Health Care Education/Training Program

## 2024-05-14 IMAGING — CT CT RENAL STONE PROTOCOL
2 of 4 series · 17 of 46 positions shown, 19 images · non-contrast
Comparison: None Available.

CLINICAL DATA: Right flank pain with episode of vomiting earlier
this week



[Series 2: axial st · axial · 0.79mm/px · z∈[+666,+1076]mm · 14 of 90 slices shown, 16 images]
[im 4/90  soft-tissue]
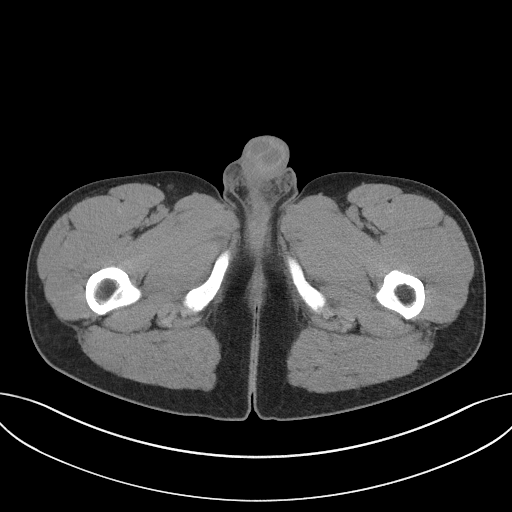
[im 4/90  bone]
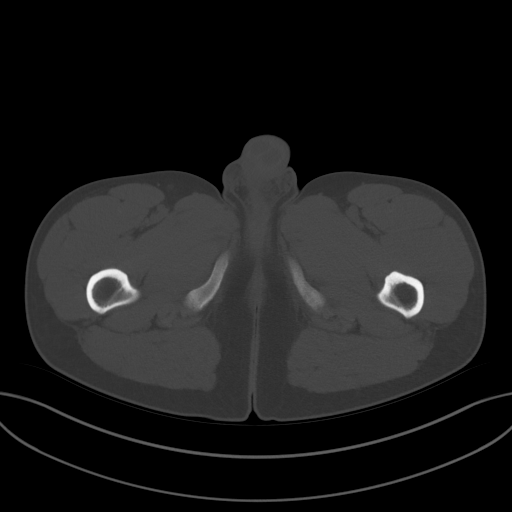
[im 11/90  soft-tissue]
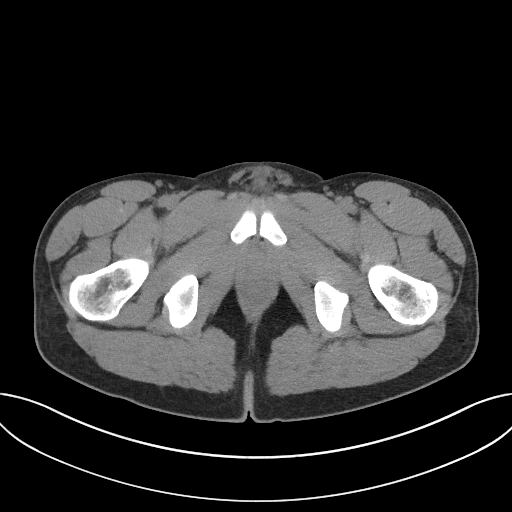
[im 18/90  soft-tissue]
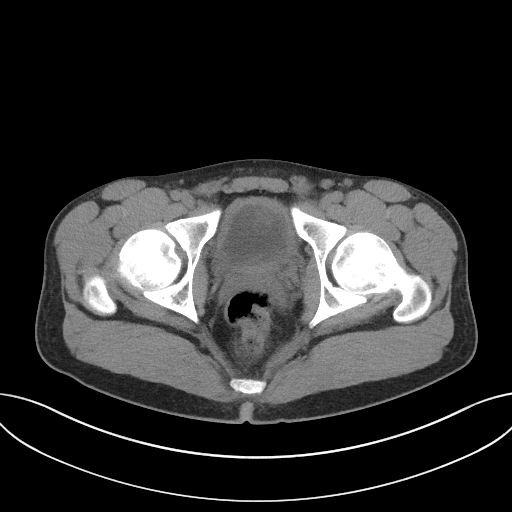
[im 25/90  soft-tissue]
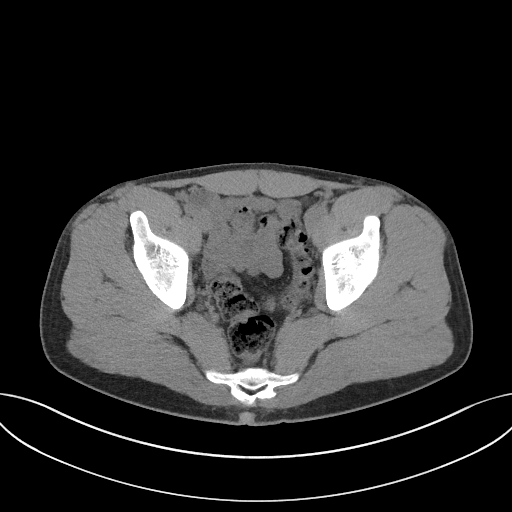
[im 29/90  soft-tissue]
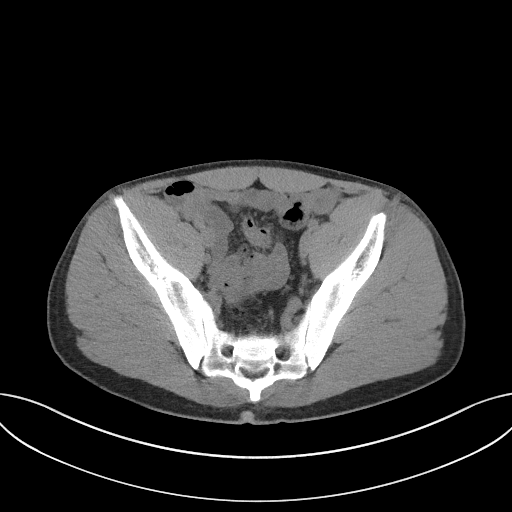
[im 36/90  soft-tissue]
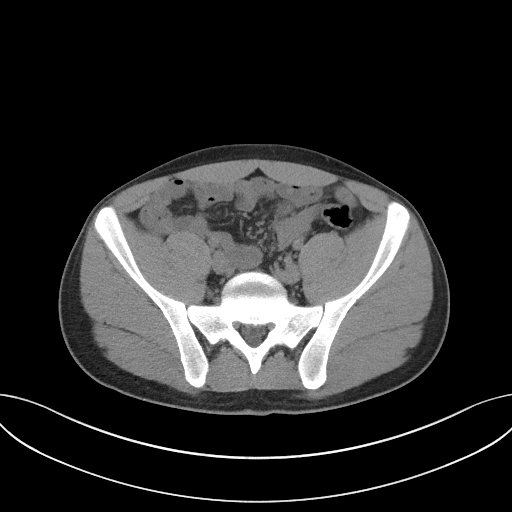
[im 43/90  soft-tissue]
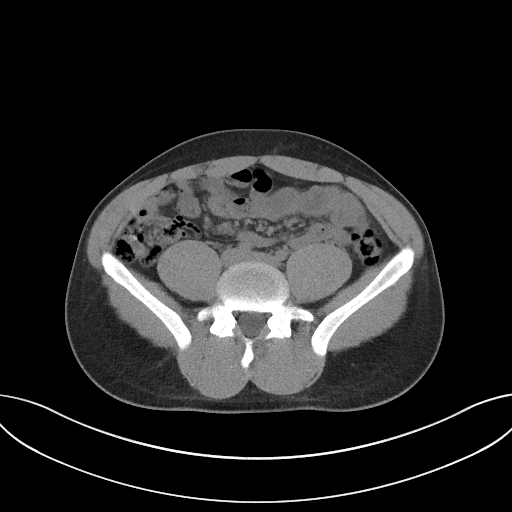
[im 47/90  soft-tissue]
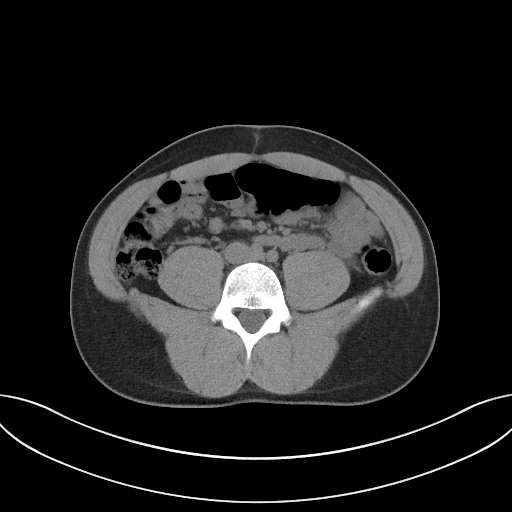
[im 54/90  soft-tissue]
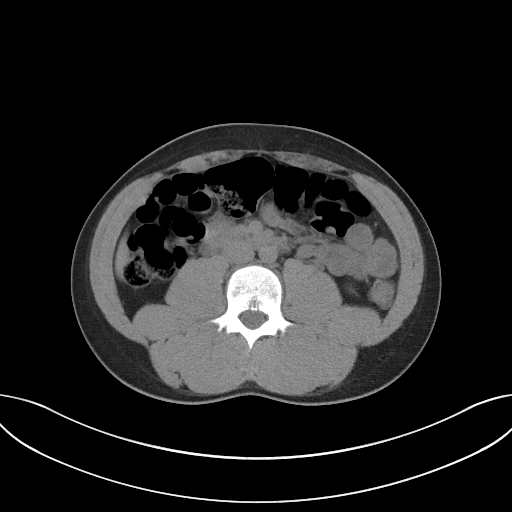
[im 54/90  bone]
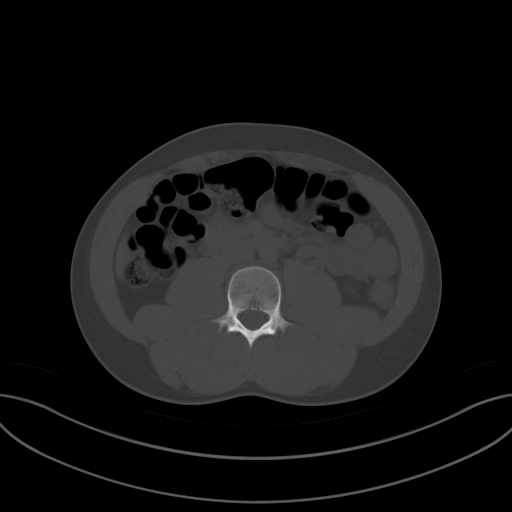
[im 61/90  soft-tissue]
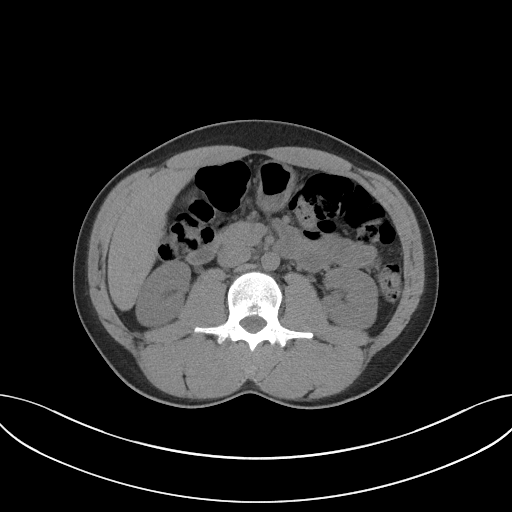
[im 68/90  soft-tissue]
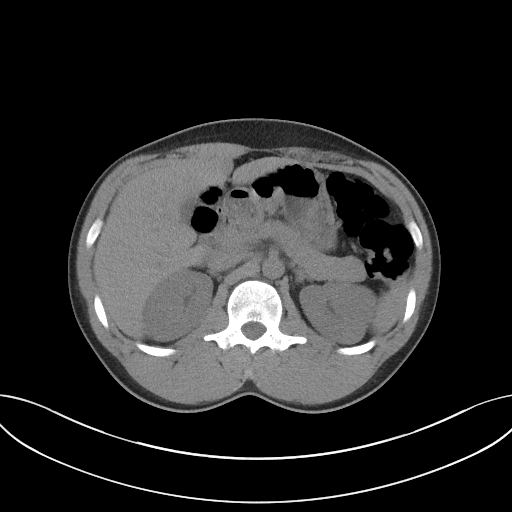
[im 72/90  soft-tissue]
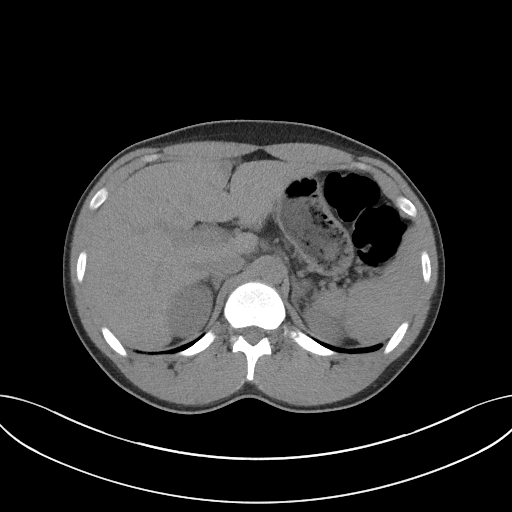
[im 79/90  soft-tissue]
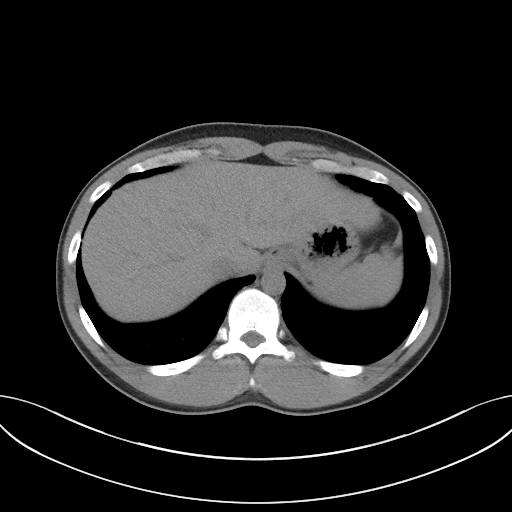
[im 86/90  soft-tissue]
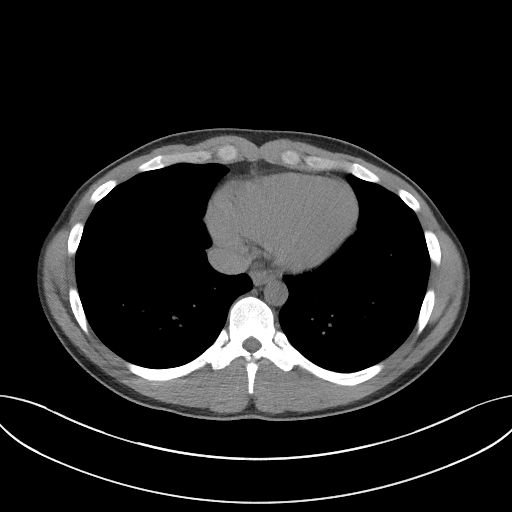

[Series 5: coronal st · coronal · 0.71mm/px · 3 of 83 slices shown]
[im 28/83  soft-tissue]
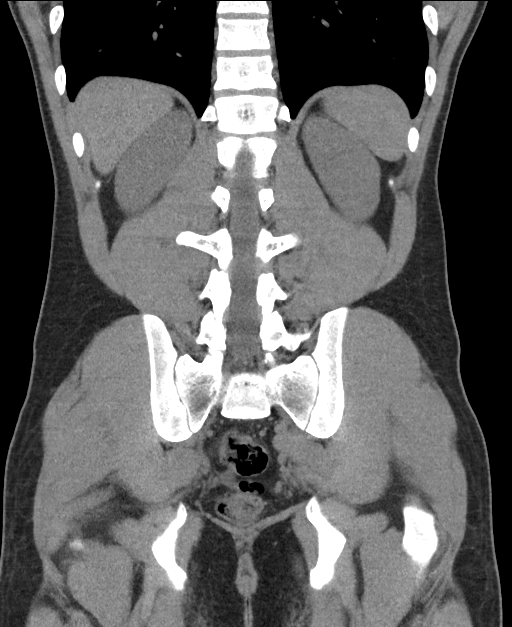
[im 37/83  soft-tissue]
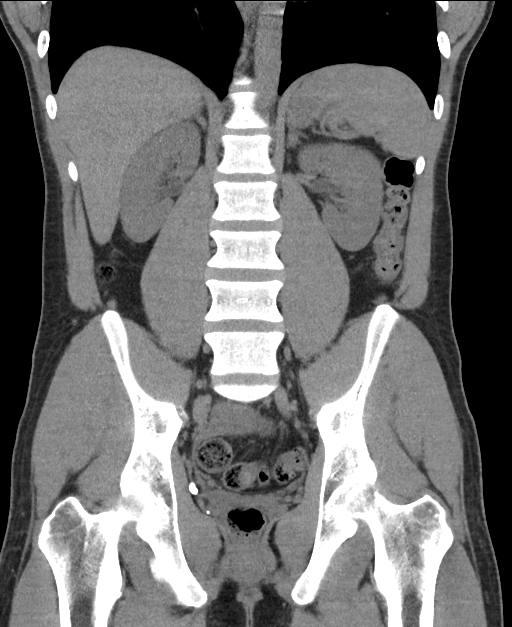
[im 46/83  soft-tissue]
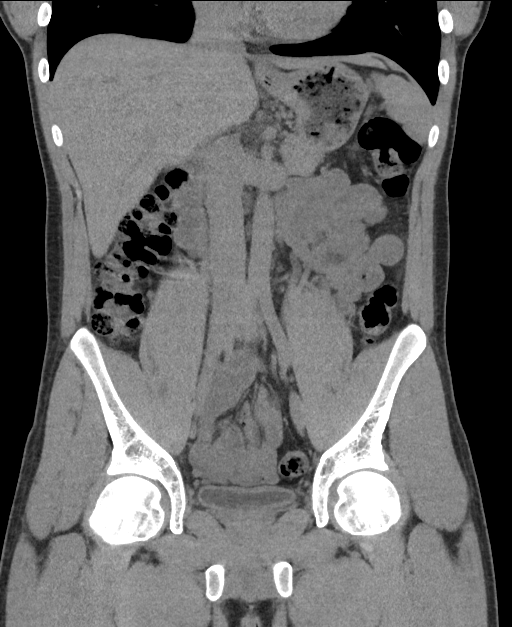

[17 of 46 positions shown; findings below may reference images not displayed]

FINDINGS: Lower chest:  No contributory findings.

Hepatobiliary: No focal liver abnormality.No evidence of biliary
obstruction or stone.

Pancreas: Unremarkable.

Spleen: Unremarkable.

Adrenals/Urinary Tract: Negative adrenals. No hydronephrosis or
stone. Unremarkable bladder.

Stomach/Bowel:  No obstruction. No appendicitis.

Vascular/Lymphatic: No acute vascular abnormality. No mass or
adenopathy.

Reproductive:No pathologic findings.

Other: No ascites or pneumoperitoneum.

Musculoskeletal: No acute abnormalities.
IMPRESSION: Negative noncontrast abdominal CT.
